# Patient Record
Sex: Male | Born: 1955 | Race: White | Hispanic: No | Marital: Single | Smoking: Never smoker
Health system: Southern US, Community
[De-identification: ages and names within clinical notes are randomized; demographics above are authoritative.]

## PROBLEM LIST (undated history)

## (undated) DIAGNOSIS — I10 Essential (primary) hypertension: Secondary | ICD-10-CM

## (undated) DIAGNOSIS — G40909 Epilepsy, unspecified, not intractable, without status epilepticus: Secondary | ICD-10-CM

## (undated) DIAGNOSIS — E079 Disorder of thyroid, unspecified: Secondary | ICD-10-CM

## (undated) DIAGNOSIS — F840 Autistic disorder: Secondary | ICD-10-CM

---

## 2013-01-12 ENCOUNTER — Encounter (HOSPITAL_COMMUNITY): Payer: Self-pay | Admitting: *Deleted

## 2013-01-12 ENCOUNTER — Emergency Department (HOSPITAL_COMMUNITY)
Admission: EM | Admit: 2013-01-12 | Discharge: 2013-01-13 | Disposition: A | Discharge status: Home / Self Care | Payer: Medicare Other | Attending: Emergency Medicine | Admitting: Emergency Medicine

## 2013-01-12 DIAGNOSIS — R6250 Unspecified lack of expected normal physiological development in childhood: Secondary | ICD-10-CM | POA: Insufficient documentation

## 2013-01-12 DIAGNOSIS — Y9389 Activity, other specified: Secondary | ICD-10-CM | POA: Insufficient documentation

## 2013-01-12 DIAGNOSIS — T50901A Poisoning by unspecified drugs, medicaments and biological substances, accidental (unintentional), initial encounter: Secondary | ICD-10-CM

## 2013-01-12 DIAGNOSIS — Y92009 Unspecified place in unspecified non-institutional (private) residence as the place of occurrence of the external cause: Secondary | ICD-10-CM | POA: Insufficient documentation

## 2013-01-12 DIAGNOSIS — T38801A Poisoning by unspecified hormones and synthetic substitutes, accidental (unintentional), initial encounter: Secondary | ICD-10-CM | POA: Insufficient documentation

## 2013-01-12 DIAGNOSIS — G40909 Epilepsy, unspecified, not intractable, without status epilepticus: Secondary | ICD-10-CM | POA: Insufficient documentation

## 2013-01-12 DIAGNOSIS — I10 Essential (primary) hypertension: Secondary | ICD-10-CM | POA: Insufficient documentation

## 2013-01-12 DIAGNOSIS — Z79899 Other long term (current) drug therapy: Secondary | ICD-10-CM | POA: Insufficient documentation

## 2013-01-12 DIAGNOSIS — R404 Transient alteration of awareness: Secondary | ICD-10-CM | POA: Insufficient documentation

## 2013-01-12 HISTORY — DX: Autistic disorder: F84.0

## 2013-01-12 HISTORY — DX: Epilepsy, unspecified, not intractable, without status epilepticus: G40.909

## 2013-01-12 HISTORY — DX: Essential (primary) hypertension: I10

## 2013-01-12 NOTE — ED Notes (Signed)
Report by caregiver pt accidentally took 10 sprays of a medication that he normally uses to prevent bedwetting. There are multiple other family/caregiver issues, but the pertinent reason is pt is exhibiting sxs consistent w/ taking too much of this medication. Pt is reported to have been lethargic and confused.

## 2013-01-12 NOTE — ED Notes (Signed)
Per family and pt report: Pt took 10 sprays of his desmopressin last night around 11pm. Pt's family has noticed increased lethargy.  Pt was observed walking into room with no difficulty.  Pt is a/o x 4.  Pt hx Autism and mild epilepsy.

## 2013-01-13 NOTE — ED Notes (Signed)
Pt bladder scan is 0ml.  MD made aware.

## 2013-01-13 NOTE — ED Provider Notes (Signed)
History     CSN: [id number]  Arrival date & time 01/12/13  2127   First MD Initiated Contact with Patient 01/12/13 2358      Chief Complaint  Patient presents with  . Drug Overdose    (Consider location/radiation/quality/duration/timing/severity/associated sxs/prior treatment) HPI Blake Richards is a 58 y.o. male with h/o M/R, autism, epilepsy who takes DDAVP occasionally for nocturnal enureis presents because he used about 10 sprays of his intranasal DDAVP thinking it was his saline for congestion.  No SI/HI.  Pt has had some difficulties recently with change in routines, but otherwise is well.  No complaints of pain.  He has been slightly sleepier today.  No other symptoms, no urinary retention, no seizure.  This event occurred 36 hours ago, last evening.  Past Medical History  Diagnosis Date  . Autism   . Epilepsy   . Hypertension     History reviewed. No pertinent past surgical history.  No family history on file.  History  Substance Use Topics  . Smoking status: Never Smoker   . Smokeless tobacco: Not on file  . Alcohol Use: No      Review of Systems At least 10pt or greater review of systems completed and are negative except where specified in the HPI.  Allergies  Lactose intolerance (gi)  Home Medications   Current Outpatient Rx  Name  Route  Sig  Dispense  Refill  . acetaZOLAMIDE (DIAMOX) 250 MG tablet   Oral   Take 250 mg by mouth 2 (two) times daily.         Marland Kitchen ALPRAZolam (XANAX) 0.25 MG tablet   Oral   Take 0.25 mg by mouth as needed for anxiety. Take a dose with increased anxiety hyperactivity after the 1mg  of clonazepam.         . benzonatate (TESSALON) 100 MG capsule   Oral   Take 200 mg by mouth 3 (three) times daily as needed for cough.         . carbamazepine (TEGRETOL XR) 400 MG 12 hr tablet   Oral   Take 800 mg by mouth 2 (two) times daily.         . clonazePAM (KLONOPIN) 1 MG tablet   Oral   Take 0.5-1 mg by mouth daily as needed  for anxiety.         Marland Kitchen desmopressin (DDAVP) 0.01 % SOLN   Nasal   Place 1 spray into the nose at bedtime.         . levETIRAcetam (KEPPRA) 500 MG tablet   Oral   Take 500-750 mg by mouth every 12 (twelve) hours. 750 moring and 500 mg in the evening         . Multiple Vitamin (MULTIVITAMIN WITH MINERALS) TABS   Oral   Take 1 tablet by mouth daily.         Marland Kitchen omega-3 acid ethyl esters (LOVAZA) 1 G capsule   Oral   Take 1 g by mouth daily.         Marland Kitchen oxymetazoline (AFRIN) 0.05 % nasal spray   Nasal   Place 1 spray into the nose as needed for congestion.         . sertraline (ZOLOFT) 100 MG tablet   Oral   Take 100 mg by mouth daily.         . simvastatin (ZOCOR) 40 MG tablet   Oral   Take 40 mg by mouth every morning.         Marland Kitchen  thyroid (ARMOUR) 60 MG tablet   Oral   Take 60 mg by mouth daily.         Marland Kitchen triamterene-hydrochlorothiazide (DYAZIDE) 37.5-25 MG per capsule   Oral   Take 1 capsule by mouth daily.         Marland Kitchen venlafaxine (EFFEXOR) 75 MG tablet   Oral   Take 75 mg by mouth daily.         Marland Kitchen VITAMIN D, ERGOCALCIFEROL, PO   Oral   Take 500 Units by mouth daily.           BP 151/95  Pulse 87  Temp(Src) 97.5 F (36.4 C) (Axillary)  Resp 15  SpO2 97%  Physical Exam  Nursing notes reviewed.  Electronic medical record reviewed. VITAL SIGNS:   Filed Vitals:   01/12/13 2137 01/12/13 2234 01/13/13 0247  BP: 151/95  136/63  Pulse: 87  71  Temp:  97.5 F (36.4 C) 97.7 F (36.5 C)  TempSrc:  Axillary Oral  Resp: 15  20  SpO2: 97%  95%   CONSTITUTIONAL: Awake, oriented, appears non-toxic HENT: Atraumatic, normocephalic, oral mucosa pink and moist, airway patent. Nares patent without drainage. External ears normal. EYES: Conjunctiva clear, EOMI, PERRLA NECK: Trachea midline, non-tender, supple CARDIOVASCULAR: Normal heart rate, Normal rhythm, No murmurs, rubs, gallops PULMONARY/CHEST: Clear to auscultation, no rhonchi, wheezes, or  rales. Symmetrical breath sounds. Non-tender. ABDOMINAL: Non-distended, soft, non-tender - no rebound or guarding.  BS normal. NEUROLOGIC: Non-focal, moving all four extremities, no gross sensory or motor deficits. EXTREMITIES: No clubbing, cyanosis, or edema SKIN: Warm, Dry, No erythema, No rash  ED Course  Procedures (including critical care time)  Labs Reviewed - No data to display No results found.   1. Accidental overdose, initial encounter       MDM  Discussed case with Poison control.  No symptoms to date, pt is at baseline now, no symptoms noted, DDAVP has a short half life.  No further intervention or observation indicated.  No co-ingestant suggested.  No other workup indicated.  DC stable in good condition to home.       Jones Skene, MD 01/16/13 1126

## 2013-10-30 ENCOUNTER — Emergency Department (HOSPITAL_COMMUNITY): Payer: Medicare Other

## 2013-10-30 ENCOUNTER — Inpatient Hospital Stay (HOSPITAL_COMMUNITY)
Admission: EM | Admit: 2013-10-30 | Discharge: 2013-11-04 | DRG: 391 | Disposition: A | Discharge status: Home / Self Care | Payer: Medicare Other | Attending: Internal Medicine | Admitting: Internal Medicine

## 2013-10-30 ENCOUNTER — Encounter (HOSPITAL_COMMUNITY): Payer: Self-pay | Admitting: Emergency Medicine

## 2013-10-30 DIAGNOSIS — N179 Acute kidney failure, unspecified: Secondary | ICD-10-CM

## 2013-10-30 DIAGNOSIS — E872 Acidosis, unspecified: Secondary | ICD-10-CM

## 2013-10-30 DIAGNOSIS — I10 Essential (primary) hypertension: Secondary | ICD-10-CM | POA: Diagnosis present

## 2013-10-30 DIAGNOSIS — Z79899 Other long term (current) drug therapy: Secondary | ICD-10-CM

## 2013-10-30 DIAGNOSIS — G9340 Encephalopathy, unspecified: Secondary | ICD-10-CM | POA: Diagnosis present

## 2013-10-30 DIAGNOSIS — A088 Other specified intestinal infections: Principal | ICD-10-CM | POA: Diagnosis present

## 2013-10-30 DIAGNOSIS — G40909 Epilepsy, unspecified, not intractable, without status epilepticus: Secondary | ICD-10-CM | POA: Diagnosis present

## 2013-10-30 DIAGNOSIS — E739 Lactose intolerance, unspecified: Secondary | ICD-10-CM | POA: Diagnosis present

## 2013-10-30 DIAGNOSIS — R197 Diarrhea, unspecified: Secondary | ICD-10-CM

## 2013-10-30 DIAGNOSIS — R112 Nausea with vomiting, unspecified: Secondary | ICD-10-CM

## 2013-10-30 DIAGNOSIS — E876 Hypokalemia: Secondary | ICD-10-CM | POA: Diagnosis present

## 2013-10-30 DIAGNOSIS — D72829 Elevated white blood cell count, unspecified: Secondary | ICD-10-CM

## 2013-10-30 DIAGNOSIS — E860 Dehydration: Secondary | ICD-10-CM | POA: Diagnosis present

## 2013-10-30 DIAGNOSIS — F840 Autistic disorder: Secondary | ICD-10-CM | POA: Diagnosis present

## 2013-10-30 LAB — COMPREHENSIVE METABOLIC PANEL
ALT: 67 U/L — ABNORMAL HIGH (ref 0–53)
AST: 19 U/L (ref 0–37)
Albumin: 3.2 g/dL — ABNORMAL LOW (ref 3.5–5.2)
Alkaline Phosphatase: 131 U/L — ABNORMAL HIGH (ref 39–117)
Chloride: 95 mEq/L — ABNORMAL LOW (ref 96–112)
Potassium: 2.2 mEq/L — CL (ref 3.5–5.1)
Sodium: 134 mEq/L — ABNORMAL LOW (ref 135–145)
Total Bilirubin: 0.5 mg/dL (ref 0.3–1.2)
Total Protein: 7.4 g/dL (ref 6.0–8.3)

## 2013-10-30 LAB — MAGNESIUM: Magnesium: 2.3 mg/dL (ref 1.5–2.5)

## 2013-10-30 LAB — CBC
HCT: 46.2 % (ref 39.0–52.0)
MCHC: 36.6 g/dL — ABNORMAL HIGH (ref 30.0–36.0)
RDW: 13.3 % (ref 11.5–15.5)
WBC: 18.2 10*3/uL — ABNORMAL HIGH (ref 4.0–10.5)

## 2013-10-30 MED ORDER — CARBAMAZEPINE ER 400 MG PO TB12
800.0000 mg | ORAL_TABLET | Freq: Two times a day (BID) | ORAL | Status: DC
  Filled 2013-10-30: qty 2

## 2013-10-30 MED ORDER — SODIUM CHLORIDE 0.9 % IV BOLUS (SEPSIS)
1000.0000 mL | Freq: Once | INTRAVENOUS | Status: AC
  Administered 2013-10-30: 1000 mL via INTRAVENOUS

## 2013-10-30 MED ORDER — ONDANSETRON HCL 4 MG/2ML IJ SOLN: 4.0000 mg | Freq: Four times a day (QID) | INTRAMUSCULAR | Status: DC | PRN

## 2013-10-30 MED ORDER — ACETAMINOPHEN 325 MG PO TABS: 650.0000 mg | ORAL_TABLET | Freq: Four times a day (QID) | ORAL | Status: DC | PRN

## 2013-10-30 MED ORDER — TRIAMTERENE-HCTZ 37.5-25 MG PO CAPS
1.0000 | ORAL_CAPSULE | Freq: Every day | ORAL | Status: DC
  Administered 2013-10-30: 20:00:00 1 via ORAL
  Filled 2013-10-30 (×2): qty 1

## 2013-10-30 MED ORDER — LEVETIRACETAM 500 MG PO TABS
500.0000 mg | ORAL_TABLET | Freq: Every evening | ORAL | Status: DC
Start: 2013-10-30 — End: 2013-11-04
  Administered 2013-10-30 – 2013-11-03 (×5): 500 mg via ORAL
  Filled 2013-10-30 (×6): qty 1

## 2013-10-30 MED ORDER — ACETAMINOPHEN 650 MG RE SUPP: 650.0000 mg | Freq: Four times a day (QID) | RECTAL | Status: DC | PRN

## 2013-10-30 MED ORDER — POTASSIUM CHLORIDE 10 MEQ/100ML IV SOLN
10.0000 meq | INTRAVENOUS | Status: AC
  Administered 2013-10-30 – 2013-10-31 (×6): 10 meq via INTRAVENOUS
  Filled 2013-10-30 (×6): qty 100

## 2013-10-30 MED ORDER — LEVETIRACETAM 500 MG PO TABS
750.0000 mg | ORAL_TABLET | Freq: Every day | ORAL | Status: DC
  Administered 2013-10-31 – 2013-11-03 (×4): 750 mg via ORAL
  Filled 2013-10-30 (×5): qty 1.5

## 2013-10-30 MED ORDER — ONDANSETRON HCL 4 MG/2ML IJ SOLN
4.0000 mg | Freq: Once | INTRAMUSCULAR | Status: AC
  Administered 2013-10-30: 4 mg via INTRAVENOUS
  Filled 2013-10-30: qty 2

## 2013-10-30 MED ORDER — ACETAZOLAMIDE 250 MG PO TABS
250.0000 mg | ORAL_TABLET | Freq: Two times a day (BID) | ORAL | Status: DC
  Administered 2013-10-30 – 2013-11-03 (×9): 250 mg via ORAL
  Filled 2013-10-30 (×11): qty 1

## 2013-10-30 MED ORDER — THYROID 60 MG PO TABS
60.0000 mg | ORAL_TABLET | Freq: Every day | ORAL | Status: DC
  Administered 2013-10-30 – 2013-11-03 (×5): 60 mg via ORAL
  Filled 2013-10-30 (×6): qty 1

## 2013-10-30 MED ORDER — SODIUM CHLORIDE 0.9 % IV SOLN
INTRAVENOUS | Status: DC
  Administered 2013-10-31 – 2013-11-01 (×2): via INTRAVENOUS

## 2013-10-30 MED ORDER — ONDANSETRON HCL 4 MG/2ML IJ SOLN: 4.0000 mg | Freq: Three times a day (TID) | INTRAMUSCULAR | Status: AC | PRN

## 2013-10-30 MED ORDER — SIMVASTATIN 40 MG PO TABS
40.0000 mg | ORAL_TABLET | Freq: Every morning | ORAL | Status: DC
  Administered 2013-10-30 – 2013-11-03 (×5): 40 mg via ORAL
  Filled 2013-10-30 (×6): qty 1

## 2013-10-30 MED ORDER — POTASSIUM CHLORIDE 10 MEQ/100ML IV SOLN
10.0000 meq | Freq: Once | INTRAVENOUS | Status: AC
  Administered 2013-10-30: 10 meq via INTRAVENOUS
  Filled 2013-10-30: qty 100

## 2013-10-30 MED ORDER — SODIUM CHLORIDE 0.9 % IJ SOLN
3.0000 mL | Freq: Two times a day (BID) | INTRAMUSCULAR | Status: DC
  Administered 2013-11-01: 10:00:00 3 mL via INTRAVENOUS

## 2013-10-30 MED ORDER — VENLAFAXINE HCL 75 MG PO TABS
75.0000 mg | ORAL_TABLET | Freq: Every day | ORAL | Status: DC
  Administered 2013-10-30 – 2013-11-03 (×5): 75 mg via ORAL
  Filled 2013-10-30 (×7): qty 1

## 2013-10-30 MED ORDER — ENOXAPARIN SODIUM 40 MG/0.4ML ~~LOC~~ SOLN
40.0000 mg | SUBCUTANEOUS | Status: DC
  Administered 2013-10-30 – 2013-11-03 (×6): 40 mg via SUBCUTANEOUS
  Filled 2013-10-30 (×6): qty 0.4

## 2013-10-30 MED ORDER — POTASSIUM CITRATE-CITRIC ACID 1100-334 MG/5ML PO SOLN: 10.0000 meq | Freq: Three times a day (TID) | ORAL | Status: DC

## 2013-10-30 MED ORDER — ONDANSETRON HCL 4 MG PO TABS: 4.0000 mg | ORAL_TABLET | Freq: Four times a day (QID) | ORAL | Status: DC | PRN

## 2013-10-30 MED ORDER — SODIUM CHLORIDE 0.9 % IV SOLN
INTRAVENOUS | Status: DC
  Administered 2013-10-30 – 2013-10-31 (×2): via INTRAVENOUS

## 2013-10-30 NOTE — H&P (Signed)
[facility]          History and Physical    PCP:   No primary provider on file.   Chief Complaint:  Lethargy, nausea, vomiting, diarrhea, weakness  HPI: Patient is a 57 year old white man with history of epilepsy as well as autism was fairly independent at baseline. His caretaker, who is the husband of patient's cousin, is present and provides most of the history as patient is currently quite lethargic. He states that since Monday (4 days prior to admission) he started experiencing projectile vomiting and explosive diarrhea. He went to his PCPs office where a flu swab reportedly was negative. He was prescribed Phenergan which seemed to control the nausea. This morning the caretaker was unable to wake patient up. He finally opened his eyes he was very groggy and fell as they were trying to walk down the stairs. He decided to bring him into the hospital for evaluation where he is found to have a potassium of 2.2 a creatinine of 4.17. We have been asked to admit him for further evaluation and management.  Allergies:   Allergies  Allergen Reactions  . Lactose Intolerance (Gi) Other (See Comments)    Upset stomach      Past Medical History  Diagnosis Date  . Autism   . Epilepsy   . Hypertension     History reviewed. No pertinent past surgical history.  Prior to Admission medications   Medication Sig Start Date End Date Taking? Authorizing Provider  acetaZOLAMIDE (DIAMOX) 250 MG tablet Take 250 mg by mouth 2 (two) times daily.   Yes Historical Provider, MD  ALPRAZolam (XANAX) 0.25 MG tablet Take 0.25 mg by mouth 2 (two) times daily as needed for anxiety.    Yes Historical Provider, MD  benzonatate (TESSALON) 100 MG capsule Take 200 mg by mouth 3 (three) times daily as needed for cough.   Yes Historical Provider, MD  carbamazepine (TEGRETOL XR) 400 MG 12 hr tablet Take 800 mg by mouth 2 (two) times daily.   Yes Historical Provider, MD  levETIRAcetam (KEPPRA) 500 MG  tablet Take 500-750 mg by mouth every 12 (twelve) hours. 750 moring and 500 mg in the evening   Yes Historical Provider, MD  promethazine (PHENERGAN) 25 MG tablet Take 25 mg by mouth every 6 (six) hours as needed for nausea or vomiting.   Yes Historical Provider, MD  sertraline (ZOLOFT) 100 MG tablet Take 100 mg by mouth daily.   Yes Historical Provider, MD  simvastatin (ZOCOR) 40 MG tablet Take 40 mg by mouth every morning.   Yes Historical Provider, MD  thyroid (ARMOUR) 60 MG tablet Take 60 mg by mouth daily.   Yes Historical Provider, MD  triamterene-hydrochlorothiazide (DYAZIDE) 37.5-25 MG per capsule Take 1 capsule by mouth daily.   Yes Historical Provider, MD  venlafaxine (EFFEXOR) 75 MG tablet Take 75 mg by mouth daily.   Yes Historical Provider, MD    Social History:  reports that he has never smoked. He does not have any smokeless tobacco history on file. He reports that he does not drink alcohol or use illicit drugs.  History reviewed. No pertinent family history.  Review of Systems:  Unable to obtain given current mental state  Physical Exam: Blood pressure 153/95, pulse 90, temperature 97.6 F (36.4 C), temperature source Axillary, resp. rate 16, height 0' (0 m), weight 96.163 kg (212 lb), SpO2 100.00%. General: Lethargic -HEENT: Normocephalic, atraumatic, pupils equal and reactive to light, very dry mucous membranes. Neck:  Supple, no JVD, no lymphadenopathy, no bruits, no goiter. Cardiovascular: Tachycardic, regular rhythm, no murmurs, rubs or gallops. Lungs: Clear auscultation bilaterally. Abdomen: Soft, nontender, nondistended, positive bowel sounds. Extremities: No clubbing, cyanosis or edema, positive pedal pulses. Small abrasion below his right knee. Neurologic: Unable to fully examine given his current mental state  Labs on Admission:  Results for orders placed during the hospital encounter of 10/30/13 (from the past 48 hour(s))  CBC     Status: Abnormal    Collection Time    10/30/13  1:43 PM      Result Value Range   WBC 18.2 (*) 4.0 - 10.5 K/uL   RBC 5.43  4.22 - 5.81 MIL/uL   Hemoglobin 16.9  13.0 - 17.0 g/dL   HCT 66.4  40.3 - 47.4 %   MCV 85.1  78.0 - 100.0 fL   MCH 31.1  26.0 - 34.0 pg   MCHC 36.6 (*) 30.0 - 36.0 g/dL   RDW 25.9  56.3 - 87.5 %   Platelets 297  150 - 400 K/uL  COMPREHENSIVE METABOLIC PANEL     Status: Abnormal   Collection Time    10/30/13  1:43 PM      Result Value Range   Sodium 134 (*) 135 - 145 mEq/L   Comment: REPEATED TO VERIFY   Potassium 2.2 (*) 3.5 - 5.1 mEq/L   Comment: CRITICAL RESULT CALLED TO, READ BACK BY AND VERIFIED WITH:     S. COE RN AT 1440 ON 12.25.14 BY SHUEA   Chloride 95 (*) 96 - 112 mEq/L   Comment: REPEATED TO VERIFY   CO2 16 (*) 19 - 32 mEq/L   Comment: REPEATED TO VERIFY   Glucose, Bld 171 (*) 70 - 99 mg/dL   BUN 97 (*) 6 - 23 mg/dL   Creatinine, Ser 6.43 (*) 0.50 - 1.35 mg/dL   Calcium 8.3 (*) 8.4 - 10.5 mg/dL   Total Protein 7.4  6.0 - 8.3 g/dL   Albumin 3.2 (*) 3.5 - 5.2 g/dL   AST 19  0 - 37 U/L   ALT 67 (*) 0 - 53 U/L   Alkaline Phosphatase 131 (*) 39 - 117 U/L   Total Bilirubin 0.5  0.3 - 1.2 mg/dL   GFR calc non Af Amer 15 (*) >90 mL/min   GFR calc Af Amer 17 (*) >90 mL/min   Comment: (NOTE)     The eGFR has been calculated using the CKD EPI equation.     This calculation has not been validated in all clinical situations.     eGFR's persistently <90 mL/min signify possible Chronic Kidney     Disease.  LIPASE, BLOOD     Status: None   Collection Time    10/30/13  1:43 PM      Result Value Range   Lipase 24  11 - 59 U/L  CARBAMAZEPINE LEVEL, TOTAL     Status: Abnormal   Collection Time    10/30/13  1:43 PM      Result Value Range   Carbamazepine Lvl 17.2 (*) 4.0 - 12.0 ug/mL   Comment: CRITICAL RESULT CALLED TO, READ BACK BY AND VERIFIED WITHBonnita Nasuti [id number] 10/30/13 D BRADLEY     Performed at [facility]  MAGNESIUM     Status: None   Collection  Time    10/30/13  1:43 PM      Result Value Range   Magnesium 2.3  1.5 -  2.5 mg/dL  LACTIC ACID, PLASMA     Status: None   Collection Time    10/30/13  3:12 PM      Result Value Range   Lactic Acid, Venous 2.1  0.5 - 2.2 mmol/L    Radiological Exams on Admission: Dg Abd Acute W/chest  10/30/2013   CLINICAL DATA:  Nausea vomiting and diarrhea.  EXAM: ACUTE ABDOMEN SERIES (ABDOMEN 2 VIEW & CHEST 1 VIEW)  COMPARISON:  None.  FINDINGS: There is a a scar versus platelike atelectasis noted in the right lung base.  The bowel gas pattern is abnormal. Within the left lower quadrant there are dilated loops of small bowel measuring up to 5.1 cm. Gas is noted within the colon up to the level of the rectum. Multiple small and large bowel fluid levels are identified.  IMPRESSION: 1. Abnormal bowel gas pattern which is nonspecific. Although the left lower quadrant small bowel loops are dilated there is gas seen throughout the colon up to the level of the rectum. Serial abdominal radiographs advised. If the patient's symptoms worsen or persist then consider further assessment with CT of the abdomen pelvis with oral contrast.   Electronically Signed   By: Signa Kell M.D.   On: 10/30/2013 15:19    Assessment/Plan Principal Problem:   Acute encephalopathy Active Problems:   Hypokalemia   Nausea with vomiting   Diarrhea   ARF (acute renal failure)   Leukocytosis, unspecified   Metabolic acidosis   Acute encephalopathy -Suspect related to a combination of his severe dehydration and sedating meds including Xanax and Phenergan. -Hold offending medications, IV fluids. -See below for details. -Do not believe a CT/MRI of the head will be necessary unless he fails to respond to fluids.  Nausea/vomiting/diarrhea -Suspect related to acute viral gastroenteritis.  -per caretaker this is already improved. -Treat symptomatically at this point. -See no need for antibiotics at this  time.  Hypokalemia -Potassium is 2.2 on admission. -Replete IV. -Check magnesium level. -Related to severe GI losses.  Acute renal failure/metabolic acidosis -Suspect related to severe dehydration.  -Normal saline at 100 cc an hour, follow renal function and electrolytes. -Consider renal ultrasound if creatinine fails to improve with fluids.  Leukocytosis -Suspect related to acute viral gastroenteritis.  Seizure disorder -Continue seizure medications.  DVT prophylaxis -Lovenox.  CODE STATUS -Full code   Time Spent on Admission: 75 minutes  HERNANDEZ ACOSTA,ESTELA [facility] Pager: [phone] 10/30/2013, 6:02 PM

## 2013-10-30 NOTE — ED Notes (Signed)
Pt undressed, warm blanket given, family sitting at bedside.

## 2013-10-30 NOTE — ED Notes (Addendum)
Per EMS pt coming from home with c/o n/v/d since Monday. Pt was seen by his PCP on Tuesday, was told has stomach virus, tested negative for flu. Pt has hx of MR and lives with cousin and his wife. Per EMS house is unkempt with abundance of cats and cat's urine and feces "is everywhere". Pt's cousin reports last emesis was little over 24 hours ago and last episode of diarrhea was this morning.

## 2013-10-30 NOTE — ED Notes (Signed)
MD at bedside. 

## 2013-10-30 NOTE — ED Notes (Signed)
Pt unable to urinate at this time. Family at bedside. Will notify staff when able.

## 2013-10-30 NOTE — ED Provider Notes (Signed)
Level V caveat patient mentally challenged history is obtained from patient's caregiver and guardian. Patient with vomiting and diarrhea onset 2 days ago, patient more sleepy today.Marland Kitchen He's been medicated with Xanax and Phenergan. No vomiting since yesterday no diarrhea since yesterday.  he's been drinking small amounts today. On exam sleepy easily arousable HEENT exam mucous membranes dry eyes pupils react to light equal to 3 mm lungs clear equal breath sounds heart regular in rhythm abdomen nondistended nontender extremities without edema. Patient noted to be hypokalemic and renal failure most likely secondary to dehydration, nausea and vomiting. Abnormally sleeping likely secondary to medication with benzodiazepines and Phenergan  Doug Sou, MD 10/30/13 1606

## 2013-10-30 NOTE — ED Notes (Signed)
Bed: WA08 Expected date:  Expected time:  Means of arrival:  Comments: Dehydratiopn N,V,D

## 2013-10-30 NOTE — ED Provider Notes (Signed)
CSN: [id number]     Arrival date & time 10/30/13  1231 History   First MD Initiated Contact with Patient 10/30/13 1233     Chief Complaint  Patient presents with  . Nausea  . Emesis  . Diarrhea   (Consider location/radiation/quality/duration/timing/severity/associated sxs/prior Treatment) HPI Comments: HPI Blake Richards is a 57 y.o. male with h/o M/R, autism, epilepsy presents to the Emergency department with a chief compliant of abdominal pain.  The history is provided by the patient's legal guardian. He reports several episodes of vomiting 3 days agp.  He was elavulated by his PCP 2 days ago and was diagnosed with a viral GI illness and was prescribed phenergan. Last emesis was 2 days ago.  He reports several episodes of non-bloody diarrhea, last episode yesterday. He reports giving the patient phenergan every 8 hours, as prescribed. The patient's caregiver reports the patient has been sleeping more over the past two days.  He reports he fell as he was trying to ambulate the patient at home. The patient's caregiver reports giving all of his daily medication today at 1000. Last Tetanus unknown. PCP: Youlanda Mighty, ([place], )  The history is provided by a caregiver and the patient. No language interpreter was used.    Past Medical History  Diagnosis Date  . Autism   . Epilepsy   . Hypertension    History reviewed. No pertinent past surgical history. History reviewed. No pertinent family history. History  Substance Use Topics  . Smoking status: Never Smoker   . Smokeless tobacco: Not on file  . Alcohol Use: No    Review of Systems  Constitutional: Positive for activity change and fatigue. Negative for fever and chills.  Gastrointestinal: Positive for nausea, vomiting, abdominal pain and diarrhea. Negative for constipation and blood in stool.  Neurological: Positive for weakness and light-headedness. Negative for seizures.    Allergies  Lactose intolerance (gi)  Home  Medications   Current Outpatient Rx  Name  Route  Sig  Dispense  Refill  . acetaZOLAMIDE (DIAMOX) 250 MG tablet   Oral   Take 250 mg by mouth 2 (two) times daily.         Marland Kitchen ALPRAZolam (XANAX) 0.25 MG tablet   Oral   Take 0.25 mg by mouth 2 (two) times daily as needed for anxiety.          . benzonatate (TESSALON) 100 MG capsule   Oral   Take 200 mg by mouth 3 (three) times daily as needed for cough.         . carbamazepine (TEGRETOL XR) 400 MG 12 hr tablet   Oral   Take 800 mg by mouth 2 (two) times daily.         Marland Kitchen levETIRAcetam (KEPPRA) 500 MG tablet   Oral   Take 500-750 mg by mouth every 12 (twelve) hours. 750 moring and 500 mg in the evening         . promethazine (PHENERGAN) 25 MG tablet   Oral   Take 25 mg by mouth every 6 (six) hours as needed for nausea or vomiting.         . sertraline (ZOLOFT) 100 MG tablet   Oral   Take 100 mg by mouth daily.         . simvastatin (ZOCOR) 40 MG tablet   Oral   Take 40 mg by mouth every morning.         . thyroid (ARMOUR) 60 MG tablet   Oral  Take 60 mg by mouth daily.         Marland Kitchen triamterene-hydrochlorothiazide (DYAZIDE) 37.5-25 MG per capsule   Oral   Take 1 capsule by mouth daily.         Marland Kitchen venlafaxine (EFFEXOR) 75 MG tablet   Oral   Take 75 mg by mouth daily.          BP 137/87  Pulse 96  Temp(Src) 100.4 F (38 C) (Rectal)  Resp 20  SpO2 95% Physical Exam  Nursing note and vitals reviewed. Constitutional: No distress.  HENT:  Head: Normocephalic and atraumatic.  Mouth/Throat: Mucous membranes are dry.  Eyes: Pupils are equal, round, and reactive to light. No scleral icterus.  Neck: Neck supple.  Pulmonary/Chest: No respiratory distress. He has no wheezes. He has no rhonchi.  Abdominal: Soft. Bowel sounds are normal. There is tenderness in the right lower quadrant. There is no rebound and no guarding.  Musculoskeletal:       Right knee: He exhibits normal range of motion and no  swelling.       Left knee: He exhibits normal range of motion and no swelling.       Legs: LE Ecchymosis to left patella.  Abrasion to right knee.  Neurological:  [place]  Skin: Skin is warm and dry.    ED Course  Procedures (including critical care time) Labs Review Labs Reviewed  CBC - Abnormal; Notable for the following:    WBC 18.2 (*)    MCHC 36.6 (*)    All other components within normal limits  COMPREHENSIVE METABOLIC PANEL - Abnormal; Notable for the following:    Sodium 134 (*)    Potassium 2.2 (*)    Chloride 95 (*)    CO2 16 (*)    Glucose, Bld 171 (*)    BUN 97 (*)    Creatinine, Ser 4.17 (*)    Calcium 8.3 (*)    Albumin 3.2 (*)    ALT 67 (*)    Alkaline Phosphatase 131 (*)    GFR calc non Af Amer 15 (*)    GFR calc Af Amer 17 (*)    All other components within normal limits  CARBAMAZEPINE LEVEL, TOTAL - Abnormal; Notable for the following:    Carbamazepine Lvl 17.2 (*)    All other components within normal limits  LIPASE, BLOOD  LACTIC ACID, PLASMA  MAGNESIUM  URINALYSIS, ROUTINE W REFLEX MICROSCOPIC   Imaging Review Dg Abd Acute W/chest  10/30/2013   CLINICAL DATA:  Nausea vomiting and diarrhea.  EXAM: ACUTE ABDOMEN SERIES (ABDOMEN 2 VIEW & CHEST 1 VIEW)  COMPARISON:  None.  FINDINGS: There is a a scar versus platelike atelectasis noted in the right lung base.  The bowel gas pattern is abnormal. Within the left lower quadrant there are dilated loops of small bowel measuring up to 5.1 cm. Gas is noted within the colon up to the level of the rectum. Multiple small and large bowel fluid levels are identified.  IMPRESSION: 1. Abnormal bowel gas pattern which is nonspecific. Although the left lower quadrant small bowel loops are dilated there is gas seen throughout the colon up to the level of the rectum. Serial abdominal radiographs advised. If the patient's symptoms worsen or persist then consider further assessment with CT of the abdomen pelvis with oral  contrast.   Electronically Signed   By: Signa Kell M.D.   On: 10/30/2013 15:19    EKG Interpretation    Date/Time:  Thursday  October 30 2013 14:54:29 EST Ventricular Rate:  95 PR Interval:  174 QRS Duration: 101 QT Interval:  342 QTC Calculation: 430 R Axis:   -42 Text Interpretation:  Sinus rhythm Probable left atrial enlargement Left axis deviation Low voltage, precordial leads RSR' in V1 or V2, probably normal variant No old tracing to compare Confirmed by JACUBOWITZ  MD, SAM ([id number]) on 10/30/2013 3:02:05 PM            MDM   1. Hypokalemia   2. Acute kidney injury    Pt with a history of GI illness presents with weakness, abdominal pain, decreased activity. PE concerning for drowsiness and generalized and RLQ.  Labs sent.Fluids started. Discussed patient history, condition, and labs with Dr. Ethelda Chick. Will replete K via IV. Mag-2.3 normal. Will continue to replete K. Hemodynamically stable. Discussed patient history and condition with Dr. Ardyth Harps who will admit the patient. Carbamazepine-17.2-cardiac monitoring ordered.    Clabe Seal, PA-C 10/31/13 [phone]

## 2013-10-31 LAB — CBC
HCT: 37.8 % — ABNORMAL LOW (ref 39.0–52.0)
MCV: 85.9 fL (ref 78.0–100.0)
Platelets: 271 10*3/uL (ref 150–400)
RBC: 4.4 MIL/uL (ref 4.22–5.81)
RDW: 13.5 % (ref 11.5–15.5)
WBC: 10.8 10*3/uL — ABNORMAL HIGH (ref 4.0–10.5)

## 2013-10-31 LAB — BASIC METABOLIC PANEL
CO2: 16 mEq/L — ABNORMAL LOW (ref 19–32)
Calcium: 7.3 mg/dL — ABNORMAL LOW (ref 8.4–10.5)
Creatinine, Ser: 2.5 mg/dL — ABNORMAL HIGH (ref 0.50–1.35)
GFR calc non Af Amer: 27 mL/min — ABNORMAL LOW (ref >90)
Sodium: 133 mEq/L — ABNORMAL LOW (ref 135–145)

## 2013-10-31 LAB — CARBAMAZEPINE LEVEL, TOTAL: Carbamazepine Lvl: 14.2 ug/mL — ABNORMAL HIGH (ref 4.0–12.0)

## 2013-10-31 MED ORDER — TRIAMTERENE-HCTZ 37.5-25 MG PO TABS
1.0000 | ORAL_TABLET | Freq: Every day | ORAL | Status: DC
  Administered 2013-10-31 – 2013-11-03 (×4): 1 via ORAL
  Filled 2013-10-31 (×5): qty 1

## 2013-10-31 MED ORDER — POTASSIUM CHLORIDE 10 MEQ/100ML IV SOLN
10.0000 meq | INTRAVENOUS | Status: DC
  Filled 2013-10-31 (×6): qty 100

## 2013-10-31 MED ORDER — POTASSIUM CHLORIDE 10 MEQ/100ML IV SOLN
10.0000 meq | INTRAVENOUS | Status: AC
  Administered 2013-10-31 (×4): 10 meq via INTRAVENOUS
  Filled 2013-10-31 (×4): qty 100

## 2013-10-31 MED ORDER — POTASSIUM CHLORIDE CRYS ER 20 MEQ PO TBCR
40.0000 meq | EXTENDED_RELEASE_TABLET | ORAL | Status: AC
  Administered 2013-10-31 – 2013-11-01 (×5): 40 meq via ORAL
  Filled 2013-10-31 (×7): qty 2

## 2013-10-31 NOTE — ED Provider Notes (Signed)
Medical screening examination/treatment/procedure(s) were conducted as a shared visit with non-physician practitioner(s) and myself.  I personally evaluated the patient during the encounter.  EKG Interpretation    Date/Time:  Thursday October 30 2013 14:54:29 EST Ventricular Rate:  95 PR Interval:  174 QRS Duration: 101 QT Interval:  342 QTC Calculation: 430 R Axis:   -42 Text Interpretation:  Sinus rhythm Probable left atrial enlargement Left axis deviation Low voltage, precordial leads RSR' in V1 or V2, probably normal variant No old tracing to compare Confirmed by Ethelda Chick  MD, Aavya Shafer ([id number]) on 10/30/2013 3:02:05 PM             Doug Sou, MD 10/31/13 1736

## 2013-10-31 NOTE — Care Management Note (Unsigned)
    Page 1 of 1   10/31/2013     12:13:42 PM   CARE MANAGEMENT NOTE 10/31/2013  Patient:  Blake Richards, Blake Richards   Account Number:  [id number]  Date Initiated:  10/31/2013  Documentation initiated by:  Colleen Can  Subjective/Objective Assessment:   dx Acute kidney injury, abd pain, hyporkalemia, dehydration     Action/Plan:   Pt from home/lives with relatives.   Anticipated DC Date:  11/02/2013   Anticipated DC Plan:        DC Planning Services  CM consult      Choice offered to / List presented to:             Status of service:  Completed, signed off Medicare Important Message given?   (If response is "NO", the following Medicare IM given date fields will be blank) Date Medicare IM given:   Date Additional Medicare IM given:    Discharge Disposition:    Per UR Regulation:  Reviewed for med. necessity/level of care/duration of stay  If discussed at Long Length of Stay Meetings, dates discussed:    Comments:

## 2013-10-31 NOTE — Progress Notes (Signed)
CRITICAL VALUE ALERT  Critical value received:  K 2.3  Date of notification:  10/31/13  Time of notification:  0522  Critical value read back:yes  Nurse who received alert:  Charolotte Capuchin   MD notified (1st page):  Benedetto Coons, NP   Time of first page:  0525  MD notified (2nd page):  Time of second page:  Responding MD:  Benedetto Coons, NP  Time MD responded:  [phone]  Orders placed for 4 runs of potassium

## 2013-10-31 NOTE — Progress Notes (Signed)
[facility] PROGRESS NOTE  Terryn Redner AVW:[id number] DOB: [birth year] DOA: 10/30/2013 PCP: No primary provider on file.  Assessment/Plan: Acute Encephalopathy -Resolved. -Back to baseline. -Suspect related to severe dehydration and sedating medications.  N/V/D -Suspect 2/2 viral gastroenteritis. -Has already resolved.  Hypokalemia -K remains low today. -Mag ok. -Continue to replete PO. -2/2 severe GI losses.   ARF/Metabolic Acidosis -Improving with IVF. -Cr was 4.17 on admission, now down to 2.50.  Seizure Disorder -Continue home meds.  Leukocytosis -2/2 viral gastroenteritis. -Improving.  Code Status: Full Code Family Communication: Caregiver at bedside  Disposition Plan: Home when stable, likely 24-48 hours.   Consultants:  None   Antibiotics:  None   Subjective: No complaints. Feels well.  Objective: Filed Vitals:   10/30/13 2212 10/31/13 0507 10/31/13 0801 10/31/13 1422  BP: 148/88 126/71  141/79  Pulse: 89 82  81  Temp: 98.1 F (36.7 C) 97.3 F (36.3 C)  97.9 F (36.6 C)  TempSrc: Axillary Axillary  Oral  Resp: 20 20  20   Height:   6\' 1"  (1.854 m)   Weight:      SpO2: 100% 100%  99%    Intake/Output Summary (Last 24 hours) at 10/31/13 1659 Last data filed at 10/31/13 1500  Gross per 24 hour  Intake    360 ml  Output    750 ml  Net   -390 ml   Filed Weights   10/30/13 1730  Weight: 96.163 kg (212 lb)    Exam:   General:  AA Ox3  Cardiovascular: RRR  Respiratory: CTA B  Abdomen: S/NT/ND/+BS  Extremities: no C/C/E/+pedal pulses   Neurologic:  Non-focal.  Data Reviewed: Basic Metabolic Panel:  Recent Labs Lab 10/30/13 1343 10/31/13 0407  NA 134* 133*  K 2.2* 2.3*  CL 95* 102  CO2 16* 16*  GLUCOSE 171* 122*  BUN 97* 73*  CREATININE 4.17* 2.50*  CALCIUM 8.3* 7.3*  MG 2.3  --    Liver Function Tests:  Recent Labs Lab 10/30/13 1343  AST 19  ALT 67*  ALKPHOS 131*  BILITOT 0.5  PROT 7.4  ALBUMIN 3.2*     Recent Labs Lab 10/30/13 1343  LIPASE 24   No results found for this basename: AMMONIA,  in the last 168 hours CBC:  Recent Labs Lab 10/30/13 1343 10/31/13 0407  WBC 18.2* 10.8*  HGB 16.9 13.5  HCT 46.2 37.8*  MCV 85.1 85.9  PLT 297 271   Cardiac Enzymes: No results found for this basename: CKTOTAL, CKMB, CKMBINDEX, TROPONINI,  in the last 168 hours BNP (last 3 results) No results found for this basename: PROBNP,  in the last 8760 hours CBG: No results found for this basename: GLUCAP,  in the last 168 hours  No results found for this or any previous visit (from the past 240 hour(s)).   Studies: Dg Abd Acute W/chest  10/30/2013   CLINICAL DATA:  Nausea vomiting and diarrhea.  EXAM: ACUTE ABDOMEN SERIES (ABDOMEN 2 VIEW & CHEST 1 VIEW)  COMPARISON:  None.  FINDINGS: There is a a scar versus platelike atelectasis noted in the right lung base.  The bowel gas pattern is abnormal. Within the left lower quadrant there are dilated loops of small bowel measuring up to 5.1 cm. Gas is noted within the colon up to the level of the rectum. Multiple small and large bowel fluid levels are identified.  IMPRESSION: 1. Abnormal bowel gas pattern which is nonspecific. Although the left lower quadrant small bowel loops  are dilated there is gas seen throughout the colon up to the level of the rectum. Serial abdominal radiographs advised. If the patient's symptoms worsen or persist then consider further assessment with CT of the abdomen pelvis with oral contrast.   Electronically Signed   By: Signa Kell M.D.   On: 10/30/2013 15:19    Scheduled Meds: . acetaZOLAMIDE  250 mg Oral BID  . enoxaparin (LOVENOX) injection  40 mg Subcutaneous Q24H  . levETIRAcetam  500 mg Oral QPM  . levETIRAcetam  750 mg Oral Daily  . potassium chloride  40 mEq Oral Q4H  . simvastatin  40 mg Oral q morning - 10a  . sodium chloride  3 mL Intravenous Q12H  . thyroid  60 mg Oral Daily  .  triamterene-hydrochlorothiazide  1 tablet Oral Daily  . venlafaxine  75 mg Oral Daily   Continuous Infusions: . sodium chloride 100 mL/hr at 10/31/13 0420  . sodium chloride      Principal Problem:   Acute encephalopathy Active Problems:   Hypokalemia   Nausea with vomiting   Diarrhea   ARF (acute renal failure)   Leukocytosis, unspecified   Metabolic acidosis    Time spent: 35 minutes. Greater than 50% of this time was spent in direct contact with the patient coordinating care.    Chaya Jan  [facility] Pager [phone]  If 7PM-7AM, please contact night-coverage at [email or web address], password [facility] 10/31/2013, 4:59 PM  LOS: 1 day

## 2013-11-01 LAB — BASIC METABOLIC PANEL
BUN: 34 mg/dL — ABNORMAL HIGH (ref 6–23)
CO2: 18 mEq/L — ABNORMAL LOW (ref 19–32)
Calcium: 8.1 mg/dL — ABNORMAL LOW (ref 8.4–10.5)
GFR calc Af Amer: 59 mL/min — ABNORMAL LOW (ref >90)
GFR calc non Af Amer: 51 mL/min — ABNORMAL LOW (ref >90)
Glucose, Bld: 131 mg/dL — ABNORMAL HIGH (ref 70–99)
Potassium: 2.4 mEq/L — CL (ref 3.5–5.1)
Sodium: 133 mEq/L — ABNORMAL LOW (ref 135–145)

## 2013-11-01 MED ORDER — ONDANSETRON HCL 4 MG/2ML IJ SOLN
4.0000 mg | Freq: Four times a day (QID) | INTRAMUSCULAR | Status: DC | PRN
  Administered 2013-11-01: 03:00:00 4 mg via INTRAVENOUS
  Filled 2013-11-01: qty 2

## 2013-11-01 MED ORDER — POTASSIUM CHLORIDE CRYS ER 20 MEQ PO TBCR
40.0000 meq | EXTENDED_RELEASE_TABLET | ORAL | Status: AC
  Administered 2013-11-01 – 2013-11-02 (×4): 40 meq via ORAL
  Filled 2013-11-01 (×6): qty 2

## 2013-11-01 MED ORDER — CARBAMAZEPINE ER 400 MG PO TB12
800.0000 mg | ORAL_TABLET | Freq: Two times a day (BID) | ORAL | Status: DC
  Administered 2013-11-01 – 2013-11-03 (×6): 800 mg via ORAL
  Filled 2013-11-01 (×8): qty 2

## 2013-11-01 NOTE — Progress Notes (Addendum)
Pharmacy Consult: Monitor Tegretol Level  55 yoM with h/o epilepsy on Carbamazepine XR 800mg  BID PTA with reported last dose on 12/25. Carbamazepine dose on admission was elevated at 17. Scr elevated at 4.17 on admission. Carbamazepine dose is not recommend for adjustment unless CrCl < 10 ml/min. MD consulted pharmacy to f/u carbamazepine and resume home dose when level is within therapeutic range as patient has been on this dose for quite some time.   Typical therapeutic levels for epilepsy is 4 -12 mcg/mL BUT if other anticonvulsants are given then therapeutic range is 4-8 mcg/mL. Patient is currently on Keppra so aiming for level of 4-8 mcg/mL   Note there is a drug-drug interaction with PTA acetazolamide but pt is on this combo chronically.  On 12/26, level in AM = 14.  Today, level is 5 mcg/mL, within therapeutic range. Scr improved to 2.5  Plan: Resume carbamazepine XR 800 mg BID as instructed by MD, next dose due at noon. Recommend to monitor carbamazepine level outpatient as not sure what led to rise in admission carbamazapine level with the exception of possible renal injury. Pharmacy will sign off.   Geoffry Paradise, PharmD, BCPS Pager: [phone] 10:41 AM Pharmacy #: [phone]

## 2013-11-01 NOTE — Progress Notes (Signed)
CRITICAL VALUE ALERT  Critical value received:  K- 2.4  Date of notification:  11/01/2013  Time of notification:  1115  Critical value read back:yes  Nurse who received alert:  Ernst Breach RN  MD notified (1st page):  Dr. Ardyth Harps   Time of first page:  1130  MD notified (2nd page):  Time of second page:  Responding MD:  Dr. Ardyth Harps   Time MD responded:  1130

## 2013-11-01 NOTE — Progress Notes (Signed)
[facility] PROGRESS NOTE  Blake Richards ZOX:[id number] DOB: [birth year] DOA: 10/30/2013 PCP: No primary provider on file.  Assessment/Plan: Acute Encephalopathy -Resolved. -Back to baseline. -Suspect related to severe dehydration and sedating medications.  N/V/D -Suspect 2/2 viral gastroenteritis. -Improving.  Hypokalemia -K remains low today at 2.4. -Mag ok at 2.3. -Continue to replete PO. -2/2 severe GI losses.   ARF/Metabolic Acidosis -Improving with IVF. -Cr was 4.17 on admission, now down to 1.48.  Seizure Disorder -Continue home meds.  Leukocytosis -2/2 viral gastroenteritis. -Improving.  Code Status: Full Code Family Communication: Caregiver at bedside  Disposition Plan: Home when stable, likely 24-48 hours, once k has been repleted.   Consultants:  None   Antibiotics:  None   Subjective: No complaints. Feels well.  Objective: Filed Vitals:   10/31/13 1422 10/31/13 2020 11/01/13 0455 11/01/13 1345  BP: 141/79 145/73 138/76 147/81  Pulse: 81 87 77 80  Temp: 97.9 F (36.6 C) 98.3 F (36.8 C) 98.4 F (36.9 C) 98.6 F (37 C)  TempSrc: Oral Axillary Axillary Axillary  Resp: 20 20 20 20   Height:      Weight:      SpO2: 99% 100% 99% 100%    Intake/Output Summary (Last 24 hours) at 11/01/13 1607 Last data filed at 11/01/13 1300  Gross per 24 hour  Intake   1120 ml  Output    725 ml  Net    395 ml   Filed Weights   10/30/13 1730  Weight: 96.163 kg (212 lb)    Exam:   General:  AA Ox3  Cardiovascular: RRR  Respiratory: CTA B  Abdomen: S/NT/ND/+BS  Extremities: no C/C/E/+pedal pulses   Neurologic:  Non-focal.  Data Reviewed: Basic Metabolic Panel:  Recent Labs Lab 10/30/13 1343 10/31/13 0407 11/01/13 0905  NA 134* 133* 133*  K 2.2* 2.3* 2.4*  CL 95* 102 102  CO2 16* 16* 18*  GLUCOSE 171* 122* 131*  BUN 97* 73* 34*  CREATININE 4.17* 2.50* 1.48*  CALCIUM 8.3* 7.3* 8.1*  MG 2.3  --   --    Liver Function  Tests:  Recent Labs Lab 10/30/13 1343  AST 19  ALT 67*  ALKPHOS 131*  BILITOT 0.5  PROT 7.4  ALBUMIN 3.2*    Recent Labs Lab 10/30/13 1343  LIPASE 24   No results found for this basename: AMMONIA,  in the last 168 hours CBC:  Recent Labs Lab 10/30/13 1343 10/31/13 0407  WBC 18.2* 10.8*  HGB 16.9 13.5  HCT 46.2 37.8*  MCV 85.1 85.9  PLT 297 271   Cardiac Enzymes: No results found for this basename: CKTOTAL, CKMB, CKMBINDEX, TROPONINI,  in the last 168 hours BNP (last 3 results) No results found for this basename: PROBNP,  in the last 8760 hours CBG: No results found for this basename: GLUCAP,  in the last 168 hours  No results found for this or any previous visit (from the past 240 hour(s)).   Studies: No results found.  Scheduled Meds: . acetaZOLAMIDE  250 mg Oral BID  . carbamazepine  800 mg Oral BID  . enoxaparin (LOVENOX) injection  40 mg Subcutaneous Q24H  . levETIRAcetam  500 mg Oral QPM  . levETIRAcetam  750 mg Oral Daily  . potassium chloride  40 mEq Oral Q4H  . simvastatin  40 mg Oral q morning - 10a  . sodium chloride  3 mL Intravenous Q12H  . thyroid  60 mg Oral Daily  . triamterene-hydrochlorothiazide  1 tablet Oral  Daily  . venlafaxine  75 mg Oral Daily   Continuous Infusions: . sodium chloride 100 mL/hr at 10/31/13 0420  . sodium chloride 100 mL/hr at 11/01/13 4540    Principal Problem:   Acute encephalopathy Active Problems:   Hypokalemia   Nausea with vomiting   Diarrhea   ARF (acute renal failure)   Leukocytosis, unspecified   Metabolic acidosis    Time spent: 25 minutes. Greater than 50% of this time was spent in direct contact with the patient coordinating care.    Chaya Jan  [facility] Pager [phone]  If 7PM-7AM, please contact night-coverage at [email or web address], password [facility] 11/01/2013, 4:07 PM  LOS: 2 days

## 2013-11-02 DIAGNOSIS — E872 Acidosis: Secondary | ICD-10-CM

## 2013-11-02 LAB — CBC
MCHC: 34.6 g/dL (ref 30.0–36.0)
RDW: 13.6 % (ref 11.5–15.5)
WBC: 6.6 10*3/uL (ref 4.0–10.5)

## 2013-11-02 LAB — BASIC METABOLIC PANEL
BUN: 21 mg/dL (ref 6–23)
BUN: 18 mg/dL (ref 6–23)
CO2: 19 mEq/L (ref 19–32)
Calcium: 8.1 mg/dL — ABNORMAL LOW (ref 8.4–10.5)
Chloride: 106 mEq/L (ref 96–112)
Creatinine, Ser: 1.39 mg/dL — ABNORMAL HIGH (ref 0.50–1.35)
GFR calc Af Amer: 64 mL/min — ABNORMAL LOW (ref >90)
GFR calc Af Amer: 67 mL/min — ABNORMAL LOW (ref >90)
GFR calc non Af Amer: 55 mL/min — ABNORMAL LOW (ref >90)
GFR calc non Af Amer: 58 mL/min — ABNORMAL LOW (ref >90)
Potassium: 2.6 mEq/L — CL (ref 3.5–5.1)
Potassium: 3.2 mEq/L — ABNORMAL LOW (ref 3.5–5.1)
Sodium: 136 mEq/L (ref 135–145)

## 2013-11-02 MED ORDER — SODIUM CHLORIDE 0.9 % IV SOLN
INTRAVENOUS | Status: DC
  Administered 2013-11-02: 15:00:00 via INTRAVENOUS
  Filled 2013-11-02 (×2): qty 1000

## 2013-11-02 MED ORDER — SODIUM CHLORIDE 0.9 % IV SOLN
INTRAVENOUS | Status: DC
  Filled 2013-11-02 (×2): qty 1000

## 2013-11-02 MED ORDER — POTASSIUM CHLORIDE 10 MEQ/100ML IV SOLN
10.0000 meq | INTRAVENOUS | Status: AC
  Administered 2013-11-02 (×4): 10 meq via INTRAVENOUS
  Filled 2013-11-02 (×4): qty 100

## 2013-11-02 MED ORDER — POTASSIUM CHLORIDE IN NACL 20-0.9 MEQ/L-% IV SOLN
INTRAVENOUS | Status: DC
  Administered 2013-11-02 – 2013-11-04 (×4): via INTRAVENOUS
  Filled 2013-11-02 (×6): qty 1000

## 2013-11-02 MED ORDER — POTASSIUM CHLORIDE CRYS ER 20 MEQ PO TBCR
40.0000 meq | EXTENDED_RELEASE_TABLET | Freq: Two times a day (BID) | ORAL | Status: DC
  Administered 2013-11-02 – 2013-11-03 (×4): 40 meq via ORAL
  Filled 2013-11-02 (×6): qty 2

## 2013-11-02 NOTE — Progress Notes (Signed)
[facility] PROGRESS NOTE  Blake Richards ZOX:[id number] DOB: 02/17/1956 DOA: 10/30/2013 PCP: No primary provider on file.  Assessment/Plan: Acute Encephalopathy  -improved. -near baseline per pt's care taker  -Suspect related to severe dehydration and sedating medications.  N/V/D  -Suspect 2/2 viral gastroenteritis.  -Improving Hypokalemia  -K remains low today at 2.6 -Recheck magnesium -Repeat BMP today -Continue to replete PO.  -2/2 severe GI losses.  -Add potassium to maintenance fluids ARF/Metabolic Acidosis  -Improving with IVF.  -Cr was 4.17 on admission, now down to 1.39 -Metabolic acidosis partly due to Diamox  Seizure Disorder  -Continue home meds.  -No seizure activity during this admission  Leukocytosis  -2/2 viral gastroenteritis.  -Improving.  Code Status: Full Code  Family Communication: Caregiver at bedside  Disposition Plan: Home when stable, likely 24 hours, once k has been repleted.      Procedures/Studies: Dg Abd Acute W/chest  10/30/2013   CLINICAL DATA:  Nausea vomiting and diarrhea.  EXAM: ACUTE ABDOMEN SERIES (ABDOMEN 2 VIEW & CHEST 1 VIEW)  COMPARISON:  None.  FINDINGS: There is a a scar versus platelike atelectasis noted in the right lung base.  The bowel gas pattern is abnormal. Within the left lower quadrant there are dilated loops of small bowel measuring up to 5.1 cm. Gas is noted within the colon up to the level of the rectum. Multiple small and large bowel fluid levels are identified.  IMPRESSION: 1. Abnormal bowel gas pattern which is nonspecific. Although the left lower quadrant small bowel loops are dilated there is gas seen throughout the colon up to the level of the rectum. Serial abdominal radiographs advised. If the patient's symptoms worsen or persist then consider further assessment with CT of the abdomen pelvis with oral contrast.   Electronically Signed   By: Signa Kell M.D.   On: 10/30/2013 15:19          Subjective: Patient denies fevers, chills, chest discomfort, shortness breath, nausea, vomiting, diarrhea. He is eating well. No abdominal pain. No dysuria.   Objective: Filed Vitals:   11/01/13 1345 11/01/13 2056 11/02/13 0527 11/02/13 1433  BP: 147/81 150/80 156/80 133/79  Pulse: 80 85 85 80  Temp: 98.6 F (37 C) 99 F (37.2 C) 97.6 F (36.4 C) 98.1 F (36.7 C)  TempSrc: Axillary Oral Oral Oral  Resp: 20 16 16 16   Height:      Weight:      SpO2: 100% 100% 98% 99%    Intake/Output Summary (Last 24 hours) at 11/02/13 1521 Last data filed at 11/02/13 1300  Gross per 24 hour  Intake 3073.33 ml  Output      0 ml  Net 3073.33 ml   Weight change:  Exam:   General:  Pt is alert, follows commands appropriately, not in acute distress  HEENT: No icterus, No thrush, No meningismus, [place]/AT  Cardiovascular: RRR, S1/S2, no rubs, no gallops  Respiratory: CTA bilaterally, no wheezing, no crackles, no rhonchi  Abdomen: Soft/+BS, non tender, non distended, no guarding  Extremities: No edema, No lymphangitis, No petechiae, No rashes, no synovitis  Data Reviewed: Basic Metabolic Panel:  Recent Labs Lab 10/30/13 1343 10/31/13 0407 11/01/13 0905 11/02/13 0502  NA 134* 133* 133* 134*  K 2.2* 2.3* 2.4* 2.6*  CL 95* 102 102 104  CO2 16* 16* 18* 19  GLUCOSE 171* 122* 131* 104*  BUN 97* 73* 34* 21  CREATININE 4.17* 2.50* 1.48* 1.39*  CALCIUM 8.3* 7.3* 8.1* 7.9*  MG 2.3  --   --   --  Liver Function Tests:  Recent Labs Lab 10/30/13 1343  AST 19  ALT 67*  ALKPHOS 131*  BILITOT 0.5  PROT 7.4  ALBUMIN 3.2*    Recent Labs Lab 10/30/13 1343  LIPASE 24   No results found for this basename: AMMONIA,  in the last 168 hours CBC:  Recent Labs Lab 10/30/13 1343 10/31/13 0407 11/02/13 0502  WBC 18.2* 10.8* 6.6  HGB 16.9 13.5 11.5*  HCT 46.2 37.8* 33.2*  MCV 85.1 85.9 86.9  PLT 297 271 282   Cardiac Enzymes: No results found for this basename:  CKTOTAL, CKMB, CKMBINDEX, TROPONINI,  in the last 168 hours BNP: No components found with this basename: POCBNP,  CBG: No results found for this basename: GLUCAP,  in the last 168 hours  No results found for this or any previous visit (from the past 240 hour(s)).   Scheduled Meds: . acetaZOLAMIDE  250 mg Oral BID  . carbamazepine  800 mg Oral BID  . enoxaparin (LOVENOX) injection  40 mg Subcutaneous Q24H  . levETIRAcetam  500 mg Oral QPM  . levETIRAcetam  750 mg Oral Daily  . potassium chloride  40 mEq Oral BID  . simvastatin  40 mg Oral q morning - 10a  . sodium chloride  3 mL Intravenous Q12H  . thyroid  60 mg Oral Daily  . triamterene-hydrochlorothiazide  1 tablet Oral Daily  . venlafaxine  75 mg Oral Daily   Continuous Infusions: . sodium chloride 0.9 % 1,000 mL with potassium chloride 20 mEq infusion 100 mL/hr at 11/02/13 1430     Karrah Mangini, Dar[place], DO  [facility] Pager [phone]  If 7PM-7AM, please contact night-coverage [email or web address] Password [email or web address] 11/02/2013, 3:21 PM   LOS: 3 days

## 2013-11-02 NOTE — Progress Notes (Signed)
K+ is 2.6 this morning.  Continues to have diarrhea but it is less urgent and a lesser amount now.  Lenny Pastel NP is on call and notified.  Awaiting response

## 2013-11-03 DIAGNOSIS — R112 Nausea with vomiting, unspecified: Secondary | ICD-10-CM

## 2013-11-03 LAB — MAGNESIUM: Magnesium: 1.7 mg/dL (ref 1.5–2.5)

## 2013-11-03 LAB — BASIC METABOLIC PANEL
BUN: 15 mg/dL (ref 6–23)
GFR calc Af Amer: 68 mL/min — ABNORMAL LOW (ref >90)
GFR calc non Af Amer: 58 mL/min — ABNORMAL LOW (ref >90)
Potassium: 3 mEq/L — ABNORMAL LOW (ref 3.5–5.1)
Sodium: 136 mEq/L (ref 135–145)

## 2013-11-03 LAB — CLOSTRIDIUM DIFFICILE BY PCR: Toxigenic C. Difficile by PCR: NEGATIVE

## 2013-11-03 LAB — TROPONIN I
Troponin I: <0.3 ng/mL (ref <0.30)
Troponin I: <0.3 ng/mL (ref <0.30)

## 2013-11-03 MED ORDER — CHOLESTYRAMINE 4 G PO PACK
4.0000 g | PACK | Freq: Four times a day (QID) | ORAL | Status: DC
  Administered 2013-11-03 (×2): 4 g via ORAL
  Filled 2013-11-03 (×6): qty 1

## 2013-11-03 MED ORDER — POTASSIUM CHLORIDE 10 MEQ/100ML IV SOLN
10.0000 meq | INTRAVENOUS | Status: AC
  Administered 2013-11-03 (×3): 10 meq via INTRAVENOUS
  Filled 2013-11-03 (×3): qty 100

## 2013-11-03 NOTE — Evaluation (Signed)
Physical Therapy Evaluation Patient Details Name: Blake Richards MRN: [id number] DOB: 12-06-1955 Today's Date: 11/03/2013 Time: 2440-1027 PT Time Calculation (min): 13 min  PT Assessment / Plan / Recommendation History of Present Illness  Patient is a 57 year old white man with history of epilepsy as well as autism was fairly independent at baseline. His caretaker, who is the husband of patient's cousin, is present and provides most of the history as patient is currently quite lethargic. He states that since Monday (4 days prior to admission) he started experiencing projectile vomiting and explosive diarrhea. He went to his PCPs office where a flu swab reportedly was negative. He was prescribed Phenergan which seemed to control the nausea. This morning the caretaker was unable to wake patient up. He finally opened his eyes he was very groggy and fell as they were trying to walk down the stairs. He decided to bring him into the hospital for evaluation where he is found to have a potassium of 2.2 a creatinine of 4.17. We have been asked to admit him for further evaluation and management.  Clinical Impression  Pt  Tolerated very well. Pt's caregiver reports that pt will return hoe. Pt will benefit from PT to address problems.     PT Assessment  Patient needs continued PT services    Follow Up Recommendations  No PT follow up    Does the patient have the potential to tolerate intense rehabilitation      Barriers to Discharge        Equipment Recommendations  None recommended by PT    Recommendations for Other Services     Frequency Min 3X/week    Precautions / Restrictions           Mobility  Bed Mobility Bed Mobility: Supine to Sit;Sit to Supine Supine to Sit: 7: Independent Sit to Supine: 7: Independent Transfers Transfers: Sit to Stand;Stand to Sit Sit to Stand: 5: Supervision Stand to Sit: 5: Supervision Ambulation/Gait Ambulation/Gait Assistance: 5: Supervision;4: Min  guard Ambulation Distance (Feet): 400 Feet Assistive device: None Gait Pattern: Within Functional Limits    Exercises     PT Diagnosis: Generalized weakness  PT Problem List: Decreased activity tolerance;Decreased mobility PT Treatment Interventions: Gait training;Stair training     PT Goals(Current goals can be found in the care plan section) Acute Rehab PT Goals Patient Stated Goal: per caregiver to walk and go home. PT Goal Formulation: With family Time For Goal Achievement: 11/10/13 Potential to Achieve Goals: Good  Visit Information  Last PT Received On: 11/03/13 Assistance Needed: +1 History of Present Illness: Patient is a 57 year old white man with history of epilepsy as well as autism was fairly independent at baseline. His caretaker, who is the husband of patient's cousin, is present and provides most of the history as patient is currently quite lethargic. He states that since Monday (4 days prior to admission) he started experiencing projectile vomiting and explosive diarrhea. He went to his PCPs office where a flu swab reportedly was negative. He was prescribed Phenergan which seemed to control the nausea. This morning the caretaker was unable to wake patient up. He finally opened his eyes he was very groggy and fell as they were trying to walk down the stairs. He decided to bring him into the hospital for evaluation where he is found to have a potassium of 2.2 a creatinine of 4.17. We have been asked to admit him for further evaluation and management.       Prior Functioning  Home Living Family/patient expects to be discharged to:: Private residence Living Arrangements: Other relatives Available Help at Discharge: Family Type of Home: House Home Access: Stairs to enter Secretary/administrator of Steps: 7 Entrance Stairs-Rails: None Home Layout: Two level Alternate Level Stairs-Number of Steps: 14 Alternate Level Stairs-Rails: Right Home Equipment: None Prior  Function Level of Independence: Independent Comments: but has supervision Communication Communication: Expressive difficulties;Receptive difficulties    Cognition  Cognition Arousal/Alertness: Awake/alert Behavior During Therapy: Impulsive Overall Cognitive Status: History of cognitive impairments - at baseline    Extremity/Trunk Assessment Upper Extremity Assessment Upper Extremity Assessment: Overall WFL for tasks assessed Lower Extremity Assessment Lower Extremity Assessment: Overall WFL for tasks assessed Cervical / Trunk Assessment Cervical / Trunk Assessment: Normal   Balance    End of Session PT - End of Session Activity Tolerance: Patient tolerated treatment well Patient left: with call bell/phone within reach;with family/visitor present Nurse Communication: Mobility status  GP     Rada Hay 11/03/2013, 4:26 PM .Blanchard Kelch PT [phone]

## 2013-11-03 NOTE — Progress Notes (Signed)
Patient complained of some mild chest pain this am. EKG done and revealed NSR. VS stable. MD was notified and new orders were placed. Will continue to monitor patient.  Setzer, Don Broach

## 2013-11-03 NOTE — Progress Notes (Addendum)
[facility] PROGRESS NOTE  Saxon Barich ZOX:[id number] DOB: 1956-08-15 DOA: 10/30/2013 PCP: No primary provider on file.  Assessment/Plan: Acute Encephalopathy  -improved.  -near baseline per pt's care taker  -Suspect related to severe dehydration and sedating medications.  N/V/D  -Suspect 2/2 viral gastroenteritis.  -Improving  -check C. difficile PCR today--->neg -add questran to slow stool Hypokalemia  -K slowly improving--3.0 today  -Recheck magnesium--1.7  -Continue to replete PO and IV.  -2/2 severe GI losses.  -Add potassium to maintenance fluids  ARF/Metabolic Acidosis  -Improving with IVF.  -Cr was 4.17 on admission, now down to 1.32  -Metabolic acidosis partly due to Diamox  Seizure Disorder  -Continue home meds.  -No seizure activity during this admission  Leukocytosis  -2/2 viral gastroenteritis.  -Improving.  Deconditioning  -Physical therapy was consulted  Code Status: Full Code  Family Communication: Caregiver at bedside  Disposition Plan: Home when stable, likely 24 hours, once k has been repleted.          Procedures/Studies: Dg Abd Acute W/chest  10/30/2013   CLINICAL DATA:  Nausea vomiting and diarrhea.  EXAM: ACUTE ABDOMEN SERIES (ABDOMEN 2 VIEW & CHEST 1 VIEW)  COMPARISON:  None.  FINDINGS: There is a a scar versus platelike atelectasis noted in the right lung base.  The bowel gas pattern is abnormal. Within the left lower quadrant there are dilated loops of small bowel measuring up to 5.1 cm. Gas is noted within the colon up to the level of the rectum. Multiple small and large bowel fluid levels are identified.  IMPRESSION: 1. Abnormal bowel gas pattern which is nonspecific. Although the left lower quadrant small bowel loops are dilated there is gas seen throughout the colon up to the level of the rectum. Serial abdominal radiographs advised. If the patient's symptoms worsen or persist then consider further assessment with CT of the abdomen  pelvis with oral contrast.   Electronically Signed   By: Signa Kell M.D.   On: 10/30/2013 15:19         Subjective: Patient continues to have at least 5-6 loose stools per day but the volume is decreasing. Denies chest pain, shortness breath, vomiting, abdominal pain, dysuria, hematuria. No headaches or rashes. No fevers or chills.  Objective: Filed Vitals:   11/02/13 1433 11/02/13 2038 11/03/13 0402 11/03/13 1348  BP: 133/79 130/79 140/90 134/88  Pulse: 80 87 94 94  Temp: 98.1 F (36.7 C) 98.7 F (37.1 C) 99 F (37.2 C) 98.3 F (36.8 C)  TempSrc: Oral Axillary Axillary Axillary  Resp: 16 20 16 18   Height:      Weight:      SpO2: 99% 99% 99% 99%    Intake/Output Summary (Last 24 hours) at 11/03/13 1702 Last data filed at 11/03/13 1400  Gross per 24 hour  Intake 4123.33 ml  Output      0 ml  Net 4123.33 ml   Weight change:  Exam:   General:  Pt is alert, follows commands appropriately, not in acute distress  HEENT: No icterus, No thrush,  [place]/AT  Cardiovascular: RRR, S1/S2, no rubs, no gallops  Respiratory: CTA bilaterally, no wheezing, no crackles, no rhonchi  Abdomen: Soft/+BS, non tender, non distended, no guarding  Extremities: trace LE edema, No lymphangitis, No petechiae, No rashes, no synovitis  Data Reviewed: Basic Metabolic Panel:  Recent Labs Lab 10/30/13 1343 10/31/13 0407 11/01/13 0905 11/02/13 0502 11/02/13 1430 11/03/13 0449  NA 134* 133* 133* 134* 136 136  K 2.2*  2.3* 2.4* 2.6* 3.2* 3.0*  CL 95* 102 102 104 106 106  CO2 16* 16* 18* 19 19 18*  GLUCOSE 171* 122* 131* 104* 100* 95  BUN 97* 73* 34* 21 18 15   CREATININE 4.17* 2.50* 1.48* 1.39* 1.33 1.32  CALCIUM 8.3* 7.3* 8.1* 7.9* 8.1* 8.2*  MG 2.3  --   --   --   --  1.7   Liver Function Tests:  Recent Labs Lab 10/30/13 1343  AST 19  ALT 67*  ALKPHOS 131*  BILITOT 0.5  PROT 7.4  ALBUMIN 3.2*    Recent Labs Lab 10/30/13 1343  LIPASE 24   No results found for this  basename: AMMONIA,  in the last 168 hours CBC:  Recent Labs Lab 10/30/13 1343 10/31/13 0407 11/02/13 0502  WBC 18.2* 10.8* 6.6  HGB 16.9 13.5 11.5*  HCT 46.2 37.8* 33.2*  MCV 85.1 85.9 86.9  PLT 297 271 282   Cardiac Enzymes:  Recent Labs Lab 11/03/13 1015 11/03/13 1604  TROPONINI <0.30 <0.30   BNP: No components found with this basename: POCBNP,  CBG: No results found for this basename: GLUCAP,  in the last 168 hours  Recent Results (from the past 240 hour(s))  CLOSTRIDIUM DIFFICILE BY PCR     Status: None   Collection Time    11/03/13 10:33 AM      Result Value Range Status   C difficile by pcr NEGATIVE  NEGATIVE Final   Comment: Performed at [facility]     Scheduled Meds: . acetaZOLAMIDE  250 mg Oral BID  . carbamazepine  800 mg Oral BID  . cholestyramine  4 g Oral QID  . enoxaparin (LOVENOX) injection  40 mg Subcutaneous Q24H  . levETIRAcetam  500 mg Oral QPM  . levETIRAcetam  750 mg Oral Daily  . potassium chloride  40 mEq Oral BID  . simvastatin  40 mg Oral q morning - 10a  . sodium chloride  3 mL Intravenous Q12H  . thyroid  60 mg Oral Daily  . triamterene-hydrochlorothiazide  1 tablet Oral Daily  . venlafaxine  75 mg Oral Daily   Continuous Infusions: . 0.9 % NaCl with KCl 20 mEq / L 100 mL/hr at 11/03/13 4540     Burtis Imhoff, [place], DO  [facility] Pager [phone]  If 7PM-7AM, please contact night-coverage [email or web address] Password [email or web address] 11/03/2013, 5:02 PM   LOS: 4 days

## 2013-11-03 NOTE — Discharge Summary (Addendum)
Physician Discharge Summary  Moustafa Mossa ZOX:[id number] DOB: Feb 07, 1956 DOA: 10/30/2013  PCP: No primary provider on file.  Admit date: 10/30/2013 Discharge date: 11/04/13 Recommendations for Outpatient Follow-up:  1. Pt will need to follow up with PCP in 2-3 days post discharge 2. Please obtain BMP to evaluate electrolytes and kidney function 3. Please also check CBC to evaluate Hg and Hct levels   Discharge Diagnoses:  Principal Problem:   Acute encephalopathy Active Problems:   Hypokalemia   Nausea with vomiting   Diarrhea   ARF (acute renal failure)   Leukocytosis, unspecified   Metabolic acidosis Acute Encephalopathy  -improved.  -back to baseline per pt's care taker  -Suspect related to severe dehydration and sedating medications.  N/V/D  -Suspect 2/2 viral gastroenteritis.  -Improving  -C. difficile PCR--neg -tolerated diet prior to d/c -The number and volume of the stools continued to improve -questran was added which helped slow down his stool and bulk his stool Hypokalemia  -K slowly improving--3.4 on day of d/c -Recheck magnesium--1.7 -Continue to replete PO and IV.  -2/2 severe GI losses.  -Add potassium to maintenance fluids  -The patient was advised to follow up with his primary care physician for blood draw in the next 2-3 days -He was sent home with potassium, 20 mEq once daily for 3 days ARF/Metabolic Acidosis  -Improving with IVF.  -Cr was 4.17 on admission -Serum creatinine 1.44 on the day of discharge -Metabolic acidosis partly due to Diamox as well as his diarrhea Seizure Disorder  -Continue home meds.  -No seizure activity during this admission  Leukocytosis  -2/2 viral gastroenteritis.  -Improving.  Deconditioning -Physical therapy was consulted and did not feel that the patient needed additional physical therapy Code Status: Full Code  Family Communication: Caregiver at bedside  Disposition Plan: Home when stable, likely 24 hours, once k  has been repleted.   Discharge Condition: Stable  Disposition:  discharge home with caretaker  Diet: Heart healthy Wt Readings from Last 3 Encounters:  10/30/13 96.163 kg (212 lb)    History of present illness:  57 year old white man with history of epilepsy as well as autism was fairly independent at baseline. His caretaker, who is the husband of patient's cousin, is present and provides most of the history as patient is currently quite lethargic. He states that since Monday 10/27/13 (4 days prior to admission) he started experiencing projectile vomiting and explosive diarrhea. He went to his PCPs office where a flu swab reportedly was negative. He was prescribed Phenergan which seemed to control the nausea. This morning the caretaker was unable to wake patient up. He finally opened his eyes he was very groggy and fell as they were trying to walk down the stairs. He decided to bring him into the hospital for evaluation where he is found to have a potassium of 2.2 a creatinine of 4.17. The patient was started on intravenous fluids. His potassium was replaced with po and IV replacement. Magnesium was checked and low. It was replaced. His renal function gradually improved with fluid resuscitation. The patient's diarrhea gradually decreased in volume; however, Questran was added which helped bulk his stools.. The patient's encephalopathy improved and returned to the baseline. C. difficile PCR was checked and was negative.    Hospital Course:    Discharge Exam: Filed Vitals:   11/04/13 0454  BP: 134/78  Pulse: 103  Temp: 98.4 F (36.9 C)  Resp: 18   Filed Vitals:   11/03/13 0402 11/03/13 1348 11/03/13 2050  11/04/13 0454  BP: 140/90 134/88 141/86 134/78  Pulse: 94 94 105 103  Temp: 99 F (37.2 C) 98.3 F (36.8 C) 98.7 F (37.1 C) 98.4 F (36.9 C)  TempSrc: Axillary Axillary Axillary Axillary  Resp: 16 18 18 18   Height:      Weight:      SpO2: 99% 99% 99% 97%   General: A&O x 3,  NAD, pleasant, cooperative Cardiovascular: RRR, no rub, no gallop, no S3 Respiratory: CTAB, no wheeze, no rhonchi Abdomen:soft, nontender, nondistended, positive bowel sounds Extremities: trace LE edema, No lymphangitis, no petechiae  Discharge Instructions      Discharge Orders   Future Orders Complete By Expires   Increase activity slowly  As directed        Medication List         acetaZOLAMIDE 250 MG tablet  Commonly known as:  DIAMOX  Take 250 mg by mouth 2 (two) times daily.     ALPRAZolam 0.25 MG tablet  Commonly known as:  XANAX  Take 0.25 mg by mouth 2 (two) times daily as needed for anxiety.     benzonatate 100 MG capsule  Commonly known as:  TESSALON  Take 200 mg by mouth 3 (three) times daily as needed for cough.     carbamazepine 400 MG 12 hr tablet  Commonly known as:  TEGRETOL XR  Take 800 mg by mouth 2 (two) times daily.     levETIRAcetam 500 MG tablet  Commonly known as:  KEPPRA  Take 500-750 mg by mouth every 12 (twelve) hours. 750 moring and 500 mg in the evening     potassium chloride SA 20 MEQ tablet  Commonly known as:  K-DUR,KLOR-CON  Take 1 tablet (20 mEq total) by mouth daily.     promethazine 25 MG tablet  Commonly known as:  PHENERGAN  Take 25 mg by mouth every 6 (six) hours as needed for nausea or vomiting.     sertraline 100 MG tablet  Commonly known as:  ZOLOFT  Take 100 mg by mouth daily.     simvastatin 40 MG tablet  Commonly known as:  ZOCOR  Take 40 mg by mouth every morning.     thyroid 60 MG tablet  Commonly known as:  ARMOUR  Take 60 mg by mouth daily.     triamterene-hydrochlorothiazide 37.5-25 MG per capsule  Commonly known as:  DYAZIDE  Take 1 capsule by mouth daily.     venlafaxine 75 MG tablet  Commonly known as:  EFFEXOR  Take 75 mg by mouth daily.         The results of significant diagnostics from this hospitalization (including imaging, microbiology, ancillary and laboratory) are listed below for  reference.    Significant Diagnostic Studies: Dg Abd Acute W/chest  10/30/2013   CLINICAL DATA:  Nausea vomiting and diarrhea.  EXAM: ACUTE ABDOMEN SERIES (ABDOMEN 2 VIEW & CHEST 1 VIEW)  COMPARISON:  None.  FINDINGS: There is a a scar versus platelike atelectasis noted in the right lung base.  The bowel gas pattern is abnormal. Within the left lower quadrant there are dilated loops of small bowel measuring up to 5.1 cm. Gas is noted within the colon up to the level of the rectum. Multiple small and large bowel fluid levels are identified.  IMPRESSION: 1. Abnormal bowel gas pattern which is nonspecific. Although the left lower quadrant small bowel loops are dilated there is gas seen throughout the colon up to the level of the  rectum. Serial abdominal radiographs advised. If the patient's symptoms worsen or persist then consider further assessment with CT of the abdomen pelvis with oral contrast.   Electronically Signed   By: Signa Kell M.D.   On: 10/30/2013 15:19     Microbiology: Recent Results (from the past 240 hour(s))  CLOSTRIDIUM DIFFICILE BY PCR     Status: None   Collection Time    11/03/13 10:33 AM      Result Value Range Status   C difficile by pcr NEGATIVE  NEGATIVE Final   Comment: Performed at [facility]     Labs: Basic Metabolic Panel:  Recent Labs Lab 10/30/13 1343  11/01/13 0905 11/02/13 0502 11/02/13 1430 11/03/13 0449 11/04/13 0450  NA 134*  < > 133* 134* 136 136 137  K 2.2*  < > 2.4* 2.6* 3.2* 3.0* 3.4*  CL 95*  < > 102 104 106 106 105  CO2 16*  < > 18* 19 19 18* 18*  GLUCOSE 171*  < > 131* 104* 100* 95 106*  BUN 97*  < > 34* 21 18 15 12   CREATININE 4.17*  < > 1.48* 1.39* 1.33 1.32 1.44*  CALCIUM 8.3*  < > 8.1* 7.9* 8.1* 8.2* 8.4  MG 2.3  --   --   --   --  1.7 1.6  < > = values in this interval not displayed. Liver Function Tests:  Recent Labs Lab 10/30/13 1343  AST 19  ALT 67*  ALKPHOS 131*  BILITOT 0.5  PROT 7.4  ALBUMIN 3.2*     Recent Labs Lab 10/30/13 1343  LIPASE 24   No results found for this basename: AMMONIA,  in the last 168 hours CBC:  Recent Labs Lab 10/30/13 1343 10/31/13 0407 11/02/13 0502 11/04/13 0450  WBC 18.2* 10.8* 6.6 11.2*  HGB 16.9 13.5 11.5* 11.7*  HCT 46.2 37.8* 33.2* 34.2*  MCV 85.1 85.9 86.9 88.6  PLT 297 271 282 384   Cardiac Enzymes:  Recent Labs Lab 11/03/13 1015 11/03/13 1604  TROPONINI <0.30 <0.30   BNP: No components found with this basename: POCBNP,  CBG: No results found for this basename: GLUCAP,  in the last 168 hours  Time coordinating discharge:  Greater than 30 minutes  Signed:  Becka Lagasse, [place], DO [facility] Pager: [phone] 11/04/2013, 8:24 AM

## 2013-11-04 LAB — CBC
HCT: 34.2 % — ABNORMAL LOW (ref 39.0–52.0)
Hemoglobin: 11.7 g/dL — ABNORMAL LOW (ref 13.0–17.0)
MCH: 30.3 pg (ref 26.0–34.0)
MCHC: 34.2 g/dL (ref 30.0–36.0)
Platelets: 384 10*3/uL (ref 150–400)
RDW: 13.9 % (ref 11.5–15.5)

## 2013-11-04 LAB — BASIC METABOLIC PANEL
BUN: 12 mg/dL (ref 6–23)
CO2: 18 mEq/L — ABNORMAL LOW (ref 19–32)
Calcium: 8.4 mg/dL (ref 8.4–10.5)
Creatinine, Ser: 1.44 mg/dL — ABNORMAL HIGH (ref 0.50–1.35)
GFR calc Af Amer: 61 mL/min — ABNORMAL LOW (ref >90)
GFR calc non Af Amer: 53 mL/min — ABNORMAL LOW (ref >90)
Sodium: 137 mEq/L (ref 137–147)

## 2013-11-04 LAB — MAGNESIUM: Magnesium: 1.6 mg/dL (ref 1.5–2.5)

## 2013-11-04 MED ORDER — POTASSIUM CHLORIDE CRYS ER 20 MEQ PO TBCR: 20.0000 meq | EXTENDED_RELEASE_TABLET | Freq: Every day | ORAL | Status: DC

## 2013-11-04 MED ORDER — POTASSIUM CHLORIDE CRYS ER 20 MEQ PO TBCR
20.0000 meq | EXTENDED_RELEASE_TABLET | Freq: Every day | ORAL | Status: DC
  Filled 2013-11-04: qty 1

## 2014-07-24 ENCOUNTER — Other Ambulatory Visit (HOSPITAL_COMMUNITY): Payer: Self-pay | Admitting: Family Medicine

## 2014-07-24 ENCOUNTER — Ambulatory Visit (HOSPITAL_COMMUNITY)
Admission: RE | Admit: 2014-07-24 | Discharge: 2014-07-24 | Disposition: A | Discharge status: Home / Self Care | Payer: Medicare Other | Source: Ambulatory Visit | Attending: Family Medicine | Admitting: Family Medicine

## 2014-07-24 ENCOUNTER — Other Ambulatory Visit: Payer: Self-pay | Admitting: Interventional Cardiology

## 2014-07-24 DIAGNOSIS — M542 Cervicalgia: Secondary | ICD-10-CM | POA: Diagnosis not present

## 2014-07-24 DIAGNOSIS — Q740 Other congenital malformations of upper limb(s), including shoulder girdle: Secondary | ICD-10-CM | POA: Diagnosis not present

## 2014-10-25 ENCOUNTER — Encounter (HOSPITAL_COMMUNITY): Payer: Self-pay | Admitting: Emergency Medicine

## 2014-10-25 ENCOUNTER — Emergency Department (HOSPITAL_COMMUNITY)
Admission: EM | Admit: 2014-10-25 | Discharge: 2014-10-25 | Disposition: A | Discharge status: Home / Self Care | Payer: Medicare Other | Attending: Emergency Medicine | Admitting: Emergency Medicine

## 2014-10-25 DIAGNOSIS — F840 Autistic disorder: Secondary | ICD-10-CM | POA: Insufficient documentation

## 2014-10-25 DIAGNOSIS — R569 Unspecified convulsions: Secondary | ICD-10-CM

## 2014-10-25 DIAGNOSIS — G40909 Epilepsy, unspecified, not intractable, without status epilepticus: Secondary | ICD-10-CM | POA: Diagnosis not present

## 2014-10-25 DIAGNOSIS — Z79899 Other long term (current) drug therapy: Secondary | ICD-10-CM | POA: Diagnosis not present

## 2014-10-25 DIAGNOSIS — I10 Essential (primary) hypertension: Secondary | ICD-10-CM | POA: Diagnosis not present

## 2014-10-25 LAB — CBC
HCT: 44.7 % (ref 39.0–52.0)
Hemoglobin: 15 g/dL (ref 13.0–17.0)
MCH: 30.6 pg (ref 26.0–34.0)
MCHC: 33.6 g/dL (ref 30.0–36.0)
MCV: 91.2 fL (ref 78.0–100.0)
PLATELETS: 346 10*3/uL (ref 150–400)
RBC: 4.9 MIL/uL (ref 4.22–5.81)
RDW: 13.6 % (ref 11.5–15.5)
WBC: 7.4 10*3/uL (ref 4.0–10.5)

## 2014-10-25 LAB — BASIC METABOLIC PANEL
Anion gap: 15 (ref 5–15)
BUN: 17 mg/dL (ref 6–23)
CO2: 23 mEq/L (ref 19–32)
Calcium: 9.5 mg/dL (ref 8.4–10.5)
Chloride: 102 mEq/L (ref 96–112)
Creatinine, Ser: 1.2 mg/dL (ref 0.50–1.35)
GFR, EST AFRICAN AMERICAN: 75 mL/min — AB (ref >90)
GFR, EST NON AFRICAN AMERICAN: 65 mL/min — AB (ref >90)
GLUCOSE: 109 mg/dL — AB (ref 70–99)
POTASSIUM: 3.3 meq/L — AB (ref 3.7–5.3)
SODIUM: 140 meq/L (ref 137–147)

## 2014-10-25 LAB — CBG MONITORING, ED: GLUCOSE-CAPILLARY: 118 mg/dL — AB (ref 70–99)

## 2014-10-25 LAB — CARBAMAZEPINE LEVEL, TOTAL: CARBAMAZEPINE LVL: 7.9 ug/mL (ref 4.0–12.0)

## 2014-10-25 MED ORDER — SODIUM CHLORIDE 0.9 % IV BOLUS (SEPSIS)
500.0000 mL | Freq: Once | INTRAVENOUS | Status: AC
  Administered 2014-10-25: 500 mL via INTRAVENOUS

## 2014-10-25 NOTE — ED Notes (Signed)
[place] [facility] c/o focal seizures today lasting less than 1 min. Hx of autism and seizures. He takes keppra and tegretol. Pt has been out of  His daily routine the past few days.

## 2014-10-25 NOTE — ED Provider Notes (Signed)
CSN: [id number]     Arrival date & time 10/25/14  1200 History   First MD Initiated Contact with Patient 10/25/14 1221     Chief Complaint  Patient presents with  . Seizures     (Consider location/radiation/quality/duration/timing/severity/associated sxs/prior Treatment) HPI Comments: 58 year old male with history of autism, epilepsy, high blood pressure, takes Tegretol Keppra and Xanax regularly as directed without recent changes [place]. Patient had brief focal seizure similar to previous lasting less than 1 minute. Patient post ictal and mild fatigue Are gradually improving. No recent infections fevers chills. Patient has changed his daily routine recently with a [place] outside of the city which sometimes throws him off with his autism history.  Patient is a 58 y.o. male presenting with seizures. The history is provided by the patient.  Seizures   Past Medical History  Diagnosis Date  . Autism   . Epilepsy   . Hypertension    History reviewed. No pertinent past surgical history. No family history on file. History  Substance Use Topics  . Smoking status: Never Smoker   . Smokeless tobacco: Not on file  . Alcohol Use: No    Review of Systems  Unable to perform ROS: Mental status change  Neurological: Positive for seizures.      Allergies  Lactose intolerance (gi)  Home Medications   Prior to Admission medications   Medication Sig Start Date End Date Taking? Authorizing Provider  acetaZOLAMIDE (DIAMOX) 250 MG tablet Take 250 mg by mouth 2 (two) times daily.   Yes Historical Provider, MD  ALPRAZolam (XANAX) 0.25 MG tablet Take 0.25 mg by mouth 2 (two) times daily as needed for anxiety.    Yes Historical Provider, MD  carbamazepine (TEGRETOL XR) 400 MG 12 hr tablet Take 800 mg by mouth 2 (two) times daily.   Yes Historical Provider, MD  levETIRAcetam (KEPPRA) 500 MG tablet Take 500-750 mg by mouth every 12 (twelve) hours. 750 morning and  500 mg in the evening   Yes Historical Provider, MD  potassium chloride SA (K-DUR,KLOR-CON) 20 MEQ tablet Take 1 tablet (20 mEq total) by mouth daily. 11/04/13  Yes Catarina Hartshornavid Tat, MD  sertraline (ZOLOFT) 100 MG tablet Take 100 mg by mouth daily.   Yes Historical Provider, MD  simvastatin (ZOCOR) 40 MG tablet Take 40 mg by mouth every morning.   Yes Historical Provider, MD  thyroid (ARMOUR) 60 MG tablet Take 60 mg by mouth daily.   Yes Historical Provider, MD  triamterene-hydrochlorothiazide (DYAZIDE) 37.5-25 MG per capsule Take 1 capsule by mouth daily.   Yes Historical Provider, MD  venlafaxine (EFFEXOR) 75 MG tablet Take 75 mg by mouth daily.   Yes Historical Provider, MD   BP 150/77 mmHg  Pulse 80  Temp(Src) 98.1 F (36.7 C) (Oral)  Resp 18  SpO2 95% Physical Exam  Constitutional: He appears well-developed and well-nourished.  HENT:  Head: Normocephalic and atraumatic.  Mild dry mucous membranes  Eyes: Conjunctivae are normal. Right eye exhibits no discharge. Left eye exhibits no discharge.  Neck: Normal range of motion. Neck supple. No tracheal deviation present.  Cardiovascular: Normal rate and regular rhythm.   Pulmonary/Chest: Effort normal and breath sounds normal.  Abdominal: Soft. He exhibits no distension. There is no tenderness. There is no guarding.  Musculoskeletal: He exhibits no edema.  Neurological: He is alert.  Patient alert and follows commands, mild drowsy but easily arousable to verbal stimulation. Mild post ictal presentation clinically. Neck supple no meningismus  full range of motion. Patient moves all extremities equal with 5+ strength bilateral. Pupils equal bilateral eczema the muscle function intact.  Skin: Skin is warm. No rash noted.  Psychiatric: He has a normal mood and affect.  Nursing note and vitals reviewed.   ED Course  Procedures (including critical care time) Labs Review Labs Reviewed  BASIC METABOLIC PANEL - Abnormal; Notable for the following:     Potassium 3.3 (*)    Glucose, Bld 109 (*)    GFR calc non Af Amer 65 (*)    GFR calc Af Amer 75 (*)    All other components within normal limits  CBG MONITORING, ED - Abnormal; Notable for the following:    Glucose-Capillary 118 (*)    All other components within normal limits  CBC  CARBAMAZEPINE LEVEL, TOTAL    Imaging Review No results found.   EKG Interpretation None      MDM   Final diagnoses:  Seizure    Patient with history of seizures on seizure medicines, no change in medicines, no recent infections, patient gradually improving to baseline in the ER. Plan for closer observation, screening blood work and if patient continues to improve to baseline close outpatient follow-up discussed.  Patient returned to baseline the ER no seizure activity. Patient caregiver comfortable with outpatient follow-up.  Results and differential diagnosis were discussed with the patient/parent/guardian. Close follow up outpatient was discussed, comfortable with the plan.   Medications  sodium chloride 0.9 % bolus 500 mL (0 mLs Intravenous Stopped 10/25/14 1509)    Filed Vitals:   10/25/14 1208 10/25/14 1417  BP: 151/94 150/77  Pulse: 91 80  Temp: 98.1 F (36.7 C)   TempSrc: Oral   Resp: 18 18  SpO2: 95% 95%    Final diagnoses:  Seizure      Enid SkeensJoshua M Bertran Zeimet, MD 10/25/14 1540

## 2014-10-25 NOTE — ED Notes (Signed)
Pt ambulated to restroom with steady.

## 2014-10-25 NOTE — Discharge Instructions (Signed)
If you were given medicines take as directed.  If you are on coumadin or contraceptives realize their levels and effectiveness is altered by many different medicines.  If you have any reaction (rash, tongues swelling, other) to the medicines stop taking and see a physician.   Please follow up as directed and return to the ER or see a physician for new or worsening symptoms.  Thank you. Filed Vitals:   10/25/14 1208 10/25/14 1417  BP: 151/94 150/77  Pulse: 91 80  Temp: 98.1 F (36.7 C)   TempSrc: Oral   Resp: 18 18  SpO2: 95% 95%

## 2014-10-25 NOTE — ED Notes (Signed)
Bed: [id number] Expected date: 10/25/14 Expected time: 12:01 PM Means of arrival:  Comments: EMS - seizure

## 2015-09-06 DIAGNOSIS — G40909 Epilepsy, unspecified, not intractable, without status epilepticus: Secondary | ICD-10-CM | POA: Insufficient documentation

## 2015-09-06 DIAGNOSIS — F840 Autistic disorder: Secondary | ICD-10-CM | POA: Insufficient documentation

## 2015-10-06 DIAGNOSIS — E039 Hypothyroidism, unspecified: Secondary | ICD-10-CM | POA: Insufficient documentation

## 2017-04-21 ENCOUNTER — Emergency Department (HOSPITAL_COMMUNITY)
Admission: EM | Admit: 2017-04-21 | Discharge: 2017-04-21 | Discharge status: Against Medical Advice | Payer: Medicare Other | Attending: Emergency Medicine | Admitting: Emergency Medicine

## 2017-04-21 ENCOUNTER — Encounter (HOSPITAL_COMMUNITY): Payer: Self-pay | Admitting: Emergency Medicine

## 2017-04-21 DIAGNOSIS — F840 Autistic disorder: Secondary | ICD-10-CM | POA: Diagnosis not present

## 2017-04-21 DIAGNOSIS — R51 Headache: Secondary | ICD-10-CM | POA: Diagnosis present

## 2017-04-21 DIAGNOSIS — I10 Essential (primary) hypertension: Secondary | ICD-10-CM | POA: Diagnosis not present

## 2017-04-21 DIAGNOSIS — R519 Headache, unspecified: Secondary | ICD-10-CM

## 2017-04-21 DIAGNOSIS — Z79899 Other long term (current) drug therapy: Secondary | ICD-10-CM | POA: Insufficient documentation

## 2017-04-21 LAB — CBC WITH DIFFERENTIAL/PLATELET
BASOS ABS: 0 10*3/uL (ref 0.0–0.1)
BASOS PCT: 1 %
Eosinophils Absolute: 0.2 10*3/uL (ref 0.0–0.7)
Eosinophils Relative: 4 %
HEMATOCRIT: 38.1 % — AB (ref 39.0–52.0)
HEMOGLOBIN: 12.3 g/dL — AB (ref 13.0–17.0)
LYMPHS PCT: 29 %
Lymphs Abs: 1.3 10*3/uL (ref 0.7–4.0)
MCH: 29.9 pg (ref 26.0–34.0)
MCHC: 32.3 g/dL (ref 30.0–36.0)
MCV: 92.5 fL (ref 78.0–100.0)
Monocytes Absolute: 0.4 10*3/uL (ref 0.1–1.0)
Monocytes Relative: 9 %
NEUTROS ABS: 2.5 10*3/uL (ref 1.7–7.7)
NEUTROS PCT: 57 %
Platelets: 302 10*3/uL (ref 150–400)
RBC: 4.12 MIL/uL — AB (ref 4.22–5.81)
RDW: 14 % (ref 11.5–15.5)
WBC: 4.4 10*3/uL (ref 4.0–10.5)

## 2017-04-21 LAB — BASIC METABOLIC PANEL
ANION GAP: 5 (ref 5–15)
BUN: 10 mg/dL (ref 6–20)
CALCIUM: 8.6 mg/dL — AB (ref 8.9–10.3)
CO2: 26 mmol/L (ref 22–32)
Chloride: 103 mmol/L (ref 101–111)
Creatinine, Ser: 1.01 mg/dL (ref 0.61–1.24)
GFR calc non Af Amer: >60 mL/min (ref >60)
GLUCOSE: 81 mg/dL (ref 65–99)
Potassium: 3.7 mmol/L (ref 3.5–5.1)
Sodium: 134 mmol/L — ABNORMAL LOW (ref 135–145)

## 2017-04-21 LAB — TSH: TSH: 0.705 u[IU]/mL (ref 0.350–4.500)

## 2017-04-21 LAB — MAGNESIUM: MAGNESIUM: 1.9 mg/dL (ref 1.7–2.4)

## 2017-04-21 MED ORDER — ACETAMINOPHEN 325 MG PO TABS
650.0000 mg | ORAL_TABLET | Freq: Once | ORAL | Status: AC
  Administered 2017-04-21: 650 mg via ORAL
  Filled 2017-04-21: qty 2

## 2017-04-21 NOTE — ED Notes (Signed)
Pt's friends/visitors agitated, stating "we're going to leave. We've been here since 12, and I'm working overtime right now, we're going home!". This RN explained that leaving would be against medical advice and that we were concerned about the pt's low heart rate. Visitors still adamant about leaving, stated "they don't care." Dr. Denton LankSteinl notified, pt left and visitors left AMA without getting CT of head.

## 2017-04-21 NOTE — ED Triage Notes (Signed)
No neuro deficits noted.

## 2017-04-21 NOTE — ED Notes (Signed)
Pt given [place] sandwich and sprite per Anna(RN)

## 2017-04-21 NOTE — ED Provider Notes (Signed)
[facility]-EMERGENCY DEPT Provider Note   CSN: [id number] Arrival date & time: 04/21/17  1240     History   Chief Complaint Chief Complaint  Patient presents with  . Headache    HPI Blake Richards is a 61 y.o. male.  HPI  Patient is a 61 year old male with past medical history significant for migraines, autism, hypertension, who presents to the emergency department with 1 day history of mild headache. Describes dull, frontal headache, nonradiating. Improved with rest and Motrin. Light worsened his headache. Patient states that a group home, they checked his blood pressure earlier today, noted that it was elevated, 180s/90s. They were concerned that something "bad was going on" so they took him to the emergency department. Not currently on any antihypertensive medications. No recent falls or head trauma. Denies fever, chills, nausea, vomiting, change in vision, chest pain, shortness of breath, extremity numbness or weakness. Patient states that he is no longer having a headache and feels much better.  Past Medical History:  Diagnosis Date  . Autism   . Epilepsy (HCC)   . Hypertension     Patient Active Problem List   Diagnosis Date Noted  . Hypokalemia 10/30/2013  . Nausea with vomiting 10/30/2013  . Diarrhea 10/30/2013  . ARF (acute renal failure) (HCC) 10/30/2013  . Acute encephalopathy 10/30/2013  . Leukocytosis, unspecified 10/30/2013  . Metabolic acidosis 10/30/2013    History reviewed. No pertinent surgical history.     Home Medications    Prior to Admission medications   Medication Sig Start Date End Date Taking? Authorizing Provider  acetaZOLAMIDE (DIAMOX) 250 MG tablet Take 250 mg by mouth 2 (two) times daily.    [provider]  ALPRAZolam Prudy Feeler) 0.25 MG tablet Take 0.25 mg by mouth 2 (two) times daily as needed for anxiety.     [provider]  carbamazepine (TEGRETOL XR) 400 MG 12 hr tablet Take 800 mg by mouth 2 (two) times daily.    [provider]  levETIRAcetam (KEPPRA) 500 MG tablet Take 500-750 mg by mouth every 12 (twelve) hours. 750 morning and 500 mg in the evening    [provider]  potassium chloride SA (K-DUR,KLOR-CON) 20 MEQ tablet Take 1 tablet (20 mEq total) by mouth daily. 11/04/13   Catarina Hartshorn, MD  sertraline (ZOLOFT) 100 MG tablet Take 100 mg by mouth daily.    [provider]  simvastatin (ZOCOR) 40 MG tablet Take 40 mg by mouth every morning.    [provider]  thyroid (ARMOUR) 60 MG tablet Take 60 mg by mouth daily.    [provider]  triamterene-hydrochlorothiazide (DYAZIDE) 37.5-25 MG per capsule Take 1 capsule by mouth daily.    [provider]  venlafaxine (EFFEXOR) 75 MG tablet Take 75 mg by mouth daily.    [provider]    Family History No family history on file.  Social History Social History  Substance Use Topics  . Smoking status: Never Smoker  . Smokeless tobacco: Not on file  . Alcohol use No     Allergies   Lactose intolerance (gi)   Review of Systems Review of Systems  Constitutional: Negative for appetite change and fever.  HENT: Negative for congestion.   Eyes: Negative for visual disturbance.  Respiratory: Negative for chest tightness and shortness of breath.   Cardiovascular: Negative for chest pain and palpitations.  Gastrointestinal: Negative for abdominal pain, blood in stool, nausea and vomiting.  Genitourinary: Negative for decreased urine volume, dysuria and  hematuria.  Musculoskeletal: Negative for back pain.  Skin: Negative for rash.  Neurological: Positive for headaches. Negative for dizziness, seizures, speech difficulty, weakness and light-headedness.  Psychiatric/Behavioral: Negative for behavioral problems and confusion.     Physical Exam Updated Vital Signs BP (!) 146/86   Pulse (!) 48   Temp 98.3 F (36.8 C) (Oral)   Resp 16   SpO2 100%   Physical Exam  Constitutional: He is oriented  to person, place, and time. He appears well-developed and well-nourished. No distress.  HENT:  Head: Atraumatic.  Mouth/Throat: Oropharynx is clear and moist.  Eyes: Conjunctivae and EOM are normal.  Neck: Normal range of motion. Neck supple.  Cardiovascular: Regular rhythm, normal heart sounds and intact distal pulses.   Bradycardia  Pulmonary/Chest: Effort normal and breath sounds normal. No respiratory distress.  Abdominal: Soft. He exhibits no distension. There is no tenderness.  Musculoskeletal: Normal range of motion.  Neurological: He is alert and oriented to person, place, and time. He has normal strength and normal reflexes. No cranial nerve deficit or sensory deficit. GCS eye subscore is 4. GCS verbal subscore is 5. GCS motor subscore is 6.  Skin: Skin is warm. Capillary refill takes less than 2 seconds.  Psychiatric: He has a normal mood and affect.     ED Treatments / Results  Labs (all labs ordered are listed, but only abnormal results are displayed) Labs Reviewed  CBC WITH DIFFERENTIAL/PLATELET - Abnormal; Notable for the following:       Result Value   RBC 4.12 (*)    Hemoglobin 12.3 (*)    HCT 38.1 (*)    All other components within normal limits  BASIC METABOLIC PANEL - Abnormal; Notable for the following:    Sodium 134 (*)    Calcium 8.6 (*)    All other components within normal limits  TSH  MAGNESIUM    EKG  EKG Interpretation  Date/Time:  Saturday April 21 2017 16:02:05 EDT Ventricular Rate:  47 PR Interval:    QRS Duration: 105 QT Interval:  456 QTC Calculation: 404 R Axis:   71 Text Interpretation:  Marked sinus bradycardia Confirmed by Denton Lank  MD, Caryn Bee ([id number]) on 04/21/2017 4:14:37 PM       Radiology No results found.  Procedures Procedures (including critical care time)  Medications Ordered in ED Medications  acetaminophen (TYLENOL) tablet 650 mg (650 mg Oral Given 04/21/17 1558)     Initial Impression / Assessment and Plan / ED  Course  I have reviewed the triage vital signs and the nursing notes.  Pertinent labs & imaging results that were available during my care of the patient were reviewed by me and considered in my medical decision making (see chart for details).  Clinical Course as of Apr 22 108  Sun Apr 22, 2017  0108 TSH: 0.705 [SM]  0108 TSH: 0.705 [SM]    Clinical Course User Index [SM] Corena Herter, MD    Patient is a 61 year old male past medical history significant for autism, thyroid disorder, who presents to the emergency department with a one-day history of dull headache. Concern for elevated blood pressure 180/90 use taken at a group home. Currently asymptomatic with a nonfocal neurologic exam. No headache at this time. Afebrile, BP 125/84, SPO2 100% on room air.  EKG showed Sinus bradycardia, normal intervals, no signs of heart block, no signs of chamber enlargement, no signs of acute ischemia. No prior EKG to compare. On chart review, previously with heart rate in  the 70s. Uncertain of the etiology of the patient's bradycardia. No leukocytosis. No significant electrolyte abnormalities. TSH within normal limits. Planning to obtain a CT head to evaluate for intraparenchymal hemorrhage as etiology of the patient's bradycardia and headache. However patient does not wish to stay for CT head. Patient continues to not have any headaches and a normal neurologic exam.  Patient stable for discharge home. Discussed follow-up with his primary care physician for further blood pressure monitoring. Given strict return precautions to the emergency department. Patient expressed understanding, no questions or concerns at time of discharge.  Final Clinical Impressions(s) / ED Diagnoses   Final diagnoses:  Acute nonintractable headache, unspecified headache type    New Prescriptions Discharge Medication List as of 04/21/2017  3:51 PM       Corena HerterMumma, Kenzel Ruesch, MD 04/22/17 0109    Cathren LaineSteinl, Kevin, MD 04/22/17  1545

## 2017-04-21 NOTE — ED Triage Notes (Signed)
Pt states his blood pressure was 180/100 earlier. Pt BP is normal at this time. No neuro deficits.

## 2017-09-18 ENCOUNTER — Other Ambulatory Visit (HOSPITAL_COMMUNITY): Payer: Self-pay | Admitting: Neurology

## 2017-09-19 ENCOUNTER — Other Ambulatory Visit (HOSPITAL_COMMUNITY): Payer: Self-pay | Admitting: Neurology

## 2017-11-02 ENCOUNTER — Other Ambulatory Visit (HOSPITAL_COMMUNITY): Payer: Self-pay | Admitting: Neurology

## 2017-11-02 DIAGNOSIS — G40311 Generalized idiopathic epilepsy and epileptic syndromes, intractable, with status epilepticus: Principal | ICD-10-CM

## 2017-11-02 DIAGNOSIS — G40419 Other generalized epilepsy and epileptic syndromes, intractable, without status epilepticus: Secondary | ICD-10-CM

## 2017-11-11 ENCOUNTER — Emergency Department
Admission: EM | Admit: 2017-11-11 | Discharge: 2017-11-11 | Disposition: A | Discharge status: Home / Self Care | Payer: Medicare Other | Attending: Emergency Medicine | Admitting: Emergency Medicine

## 2017-11-11 DIAGNOSIS — F840 Autistic disorder: Secondary | ICD-10-CM | POA: Insufficient documentation

## 2017-11-11 DIAGNOSIS — R569 Unspecified convulsions: Secondary | ICD-10-CM | POA: Diagnosis present

## 2017-11-11 DIAGNOSIS — G40909 Epilepsy, unspecified, not intractable, without status epilepticus: Secondary | ICD-10-CM | POA: Diagnosis not present

## 2017-11-11 DIAGNOSIS — Z79899 Other long term (current) drug therapy: Secondary | ICD-10-CM | POA: Insufficient documentation

## 2017-11-11 DIAGNOSIS — I10 Essential (primary) hypertension: Secondary | ICD-10-CM | POA: Diagnosis not present

## 2017-11-11 LAB — CBC WITH DIFFERENTIAL/PLATELET
BASOS PCT: 0 %
Basophils Absolute: 0 10*3/uL (ref 0–0.1)
EOS ABS: 0.1 10*3/uL (ref 0–0.7)
Eosinophils Relative: 1 %
HCT: 39.5 % — ABNORMAL LOW (ref 40.0–52.0)
Hemoglobin: 13 g/dL (ref 13.0–18.0)
Lymphocytes Relative: 7 %
Lymphs Abs: 0.5 10*3/uL — ABNORMAL LOW (ref 1.0–3.6)
MCH: 31 pg (ref 26.0–34.0)
MCHC: 32.9 g/dL (ref 32.0–36.0)
MCV: 94.1 fL (ref 80.0–100.0)
MONO ABS: 0.5 10*3/uL (ref 0.2–1.0)
MONOS PCT: 7 %
Neutro Abs: 5.5 10*3/uL (ref 1.4–6.5)
Neutrophils Relative %: 85 %
Platelets: 314 10*3/uL (ref 150–440)
RBC: 4.2 MIL/uL — ABNORMAL LOW (ref 4.40–5.90)
RDW: 14.9 % — AB (ref 11.5–14.5)
WBC: 6.6 10*3/uL (ref 3.8–10.6)

## 2017-11-11 LAB — BASIC METABOLIC PANEL
Anion gap: 6 (ref 5–15)
BUN: 12 mg/dL (ref 6–20)
CALCIUM: 8.5 mg/dL — AB (ref 8.9–10.3)
CO2: 27 mmol/L (ref 22–32)
CREATININE: 0.84 mg/dL (ref 0.61–1.24)
Chloride: 107 mmol/L (ref 101–111)
GFR calc non Af Amer: >60 mL/min (ref >60)
Glucose, Bld: 98 mg/dL (ref 65–99)
Potassium: 3.3 mmol/L — ABNORMAL LOW (ref 3.5–5.1)
SODIUM: 140 mmol/L (ref 135–145)

## 2017-11-11 LAB — CARBAMAZEPINE LEVEL, TOTAL: CARBAMAZEPINE LVL: 8.2 ug/mL (ref 4.0–12.0)

## 2017-11-11 MED ORDER — SODIUM CHLORIDE 0.9 % IV BOLUS (SEPSIS)
1000.0000 mL | Freq: Once | INTRAVENOUS | Status: AC
  Administered 2017-11-11: 1000 mL via INTRAVENOUS

## 2017-11-11 NOTE — ED Provider Notes (Signed)
[facility] Emergency Department Provider Note  ____________________________________________   First MD Initiated Contact with Patient 11/11/17 1437     (approximate)  I have reviewed the triage vital signs and the nursing notes.   HISTORY  Chief Complaint Seizures   HPI Blake Richards is a 62 y.o. male with a history of autism as well as epilepsy who is presenting to the emergency department today after seizure x2 today.  EMS reports that he did seizure earlier this morning at about 8:30 AM which consisted of 1-2 minutes of generalized tonic-clonic activity.  The patient then had an additional seizure episode just prior to arrival that was similar in character to the first.  However at this time, EMS was called and the patient was transported to the hospital.  The patient reports taking Tegretol and not missing any dosages.  EMS is that the patient was confused after their arrival but has improved in route.  Patient is denying any pain.  Denies any focal weakness or numbness.  Glucose en route was 81.  Patient says that he is only had one banana to eat all day today.  No trauma reported during or as a result of the seizure.  Past Medical History:  Diagnosis Date  . Autism   . Epilepsy (HCC)   . Hypertension     Patient Active Problem List   Diagnosis Date Noted  . Hypokalemia 10/30/2013  . Nausea with vomiting 10/30/2013  . Diarrhea 10/30/2013  . ARF (acute renal failure) (HCC) 10/30/2013  . Acute encephalopathy 10/30/2013  . Leukocytosis, unspecified 10/30/2013  . Metabolic acidosis 10/30/2013    No past surgical history on file.  Prior to Admission medications   Medication Sig Start Date End Date Taking? Authorizing Provider  acetaZOLAMIDE (DIAMOX) 250 MG tablet Take 250 mg by mouth 2 (two) times daily.    [provider]  ALPRAZolam Prudy Feeler(XANAX) 0.25 MG tablet Take 0.25 mg by mouth 2 (two) times daily as needed for anxiety.     [provider]  carbamazepine (TEGRETOL XR) 400 MG 12 hr tablet Take 800 mg by mouth 2 (two) times daily.    [provider]  levETIRAcetam (KEPPRA) 500 MG tablet Take 500-750 mg by mouth every 12 (twelve) hours. 750 morning and 500 mg in the evening    [provider]  potassium chloride SA (K-DUR,KLOR-CON) 20 MEQ tablet Take 1 tablet (20 mEq total) by mouth daily. 11/04/13   Catarina Hartshornat, Urian, MD  sertraline (ZOLOFT) 100 MG tablet Take 100 mg by mouth daily.    [provider]  simvastatin (ZOCOR) 40 MG tablet Take 40 mg by mouth every morning.    [provider]  thyroid (ARMOUR) 60 MG tablet Take 60 mg by mouth daily.    [provider]  triamterene-hydrochlorothiazide (DYAZIDE) 37.5-25 MG per capsule Take 1 capsule by mouth daily.    [provider]  venlafaxine (EFFEXOR) 75 MG tablet Take 75 mg by mouth daily.    [provider]    Allergies Lactose intolerance (gi)  No family history on file.  Social History Social History   Tobacco Use  . Smoking status: Never Smoker  Substance Use Topics  . Alcohol use: No  . Drug use: No    Review of Systems  Constitutional: No fever/chills Eyes: No visual changes. ENT: No sore throat. Cardiovascular: Denies chest pain. Respiratory: Denies shortness of breath. Gastrointestinal: No abdominal pain.  No nausea, no vomiting.  No diarrhea.  No constipation. Genitourinary: Negative for dysuria. Musculoskeletal: Negative for back pain. Skin: Negative for rash. Neurological: Negative for headaches, focal weakness or numbness.   ____________________________________________   PHYSICAL EXAM:  VITAL SIGNS: ED Triage Vitals  Enc Vitals Group     BP 11/11/17 1438 129/84     Pulse Rate 11/11/17 1438 84     Resp 11/11/17 1438 19     Temp 11/11/17 1438 97.6 F (36.4 C)     Temp Source 11/11/17 1438 Oral     SpO2 11/11/17 1438 99 %     Weight 11/11/17 1437 215 lb (97.5 kg)      Height 11/11/17 1437 6' (1.829 m)     Head Circumference --      Peak Flow --      Pain Score --      Pain Loc --      Pain Edu? --      Excl. in GC? --     Constitutional: Alert and oriented but is slow to respond.  Patient is oriented x3. Well appearing and in no acute distress. Eyes: Conjunctivae are normal.  Head: Atraumatic. Nose: No congestion/rhinnorhea. Mouth/Throat: Mucous membranes are moist.  Neck: No stridor.   Cardiovascular: Normal rate, regular rhythm. Grossly normal heart sounds.   Respiratory: Normal respiratory effort.  No retractions. Lungs CTAB. Gastrointestinal: Soft and nontender. No distention.  Musculoskeletal: No lower extremity tenderness nor edema.  No joint effusions. Neurologic:  Normal speech and language. No gross focal neurologic deficits are appreciated. Skin:  Skin is warm, dry and intact. No rash noted. Psychiatric: Mood and affect are normal. Speech and behavior are normal.  ____________________________________________   LABS (all labs ordered are listed, but only abnormal results are displayed)  Labs Reviewed  CBC WITH DIFFERENTIAL/PLATELET - Abnormal; Notable for the following components:      Result Value   RBC 4.20 (*)    HCT 39.5 (*)    RDW 14.9 (*)    Lymphs Abs 0.5 (*)    All other components within normal limits  BASIC METABOLIC PANEL - Abnormal; Notable for the following components:   Potassium 3.3 (*)    Calcium 8.5 (*)    All other components within normal limits  CARBAMAZEPINE LEVEL, TOTAL  URINALYSIS, COMPLETE (UACMP) WITH MICROSCOPIC   ____________________________________________  EKG  ED ECG REPORT I, Arelia Longest, the attending physician, personally viewed and interpreted this ECG.   Date: 11/11/2017  EKG Time: 1436  Rate: 84  Rhythm: Normal sinus rhythm  Axis: Normal   Intervals:Prolonged PR  ST&T Change: No ST segment elevation or depression.  No abnormal T wave  inversion.  ____________________________________________  RADIOLOGY   ____________________________________________   PROCEDURES  Procedure(s) performed:   Procedures  Critical Care performed:   ____________________________________________   INITIAL IMPRESSION / ASSESSMENT AND PLAN / ED COURSE  Pertinent labs & imaging results that were available during my care of the patient were reviewed by me and considered in my medical decision making (see chart for details).  DDX: Seizure, postictal state, UTI, subtherapeutic on Tegretol, breakthrough seizure, electrolyte abnormality, renal failure As part of my medical decision making, I reviewed the following data within the electronic MEDICAL RECORD NUMBER Notes from prior ED visits as well as family medicine visit.  ----------------------------------------- 3:58 PM on 11/11/2017 ----------------------------------------- Patient's legal guardian and family now at the bedside.  They corroborate the EMS report that the episodes today were 1-2 minutes and generalized tonic-clonic activity.  The guardian states  that the patient has not missed any doses of his medications.  Says that the patient recently started Resporal 1-2 months ago.  Also corroborates that the last seizure was in late August.  Usually, per family and guardian, the patient has seizures when he has overheated or stress.  However, they do not think he has been under either of these conditions recently.    ----------------------------------------- 6:09 PM on 11/11/2017 -----------------------------------------  I discussed the case with the patient's neurologist, Dr. Arville Go, who does not recommend any medication changes at this time but recommends the patient follow-up in the office tomorrow.  She recommends that the patient call at [phone] for that the power of attorney: [phone] to schedule a same-day appointment.  I explained this plan to the patient as well as the caretaker.   They are understanding and willing to comply.  The patient maintains his baseline mental status.  He has not any seizure activity here in the emergency department.  ____________________________________________   FINAL CLINICAL IMPRESSION(S) / ED DIAGNOSES  Seizure    NEW MEDICATIONS STARTED DURING THIS VISIT:  This SmartLink is deprecated. Use AVSMEDLIST instead to display the medication list for a patient.   Note:  This document was prepared using Dragon voice recognition software and may include unintentional dictation errors.     Myrna Blazer, MD 11/11/17 [phone]

## 2017-11-11 NOTE — ED Notes (Signed)
Attempted to contact family/caregiver. No answer from numbers provided in chart.

## 2017-11-11 NOTE — ED Notes (Signed)
Spoke with group home owner Eber JonesCarolyn about plan to d/c patient back to her facility. Legal guardian at bedside who will be providing transportation back to group home. Legal guardian signed for discharge.

## 2017-11-11 NOTE — ED Notes (Signed)
MD verifies pt can have food/drink. Pt given sandwich tray and drink

## 2017-11-11 NOTE — ED Triage Notes (Signed)
Pt arrived via ems from family's house after family reported a seizure. Family reported 2 seizures today, one this am and one just prior to arrival. EMS reports seizure only lasted "a minute or two." EMS reports a recent change in medication but a compliance in medication. Pt's legal guardian nor family are at bedside.

## 2017-11-12 ENCOUNTER — Ambulatory Visit (HOSPITAL_COMMUNITY): Payer: Medicare Other

## 2017-11-13 ENCOUNTER — Telehealth: Payer: Self-pay | Admitting: Neurology

## 2017-11-13 ENCOUNTER — Other Ambulatory Visit (HOSPITAL_COMMUNITY): Payer: Self-pay | Admitting: Neurology

## 2017-11-13 DIAGNOSIS — G40311 Generalized idiopathic epilepsy and epileptic syndromes, intractable, with status epilepticus: Principal | ICD-10-CM

## 2017-11-13 DIAGNOSIS — G40419 Other generalized epilepsy and epileptic syndromes, intractable, without status epilepticus: Secondary | ICD-10-CM

## 2017-11-14 ENCOUNTER — Ambulatory Visit (HOSPITAL_COMMUNITY): Payer: Medicare Other

## 2017-11-14 ENCOUNTER — Encounter (HOSPITAL_COMMUNITY): Payer: Self-pay

## 2017-11-14 ENCOUNTER — Ambulatory Visit (HOSPITAL_COMMUNITY)
Admission: RE | Admit: 2017-11-14 | Discharge: 2017-11-14 | Disposition: A | Discharge status: Home / Self Care | Payer: Medicare Other | Source: Ambulatory Visit | Attending: Neurology | Admitting: Neurology

## 2017-11-14 DIAGNOSIS — G9389 Other specified disorders of brain: Secondary | ICD-10-CM | POA: Insufficient documentation

## 2017-11-14 DIAGNOSIS — G40311 Generalized idiopathic epilepsy and epileptic syndromes, intractable, with status epilepticus: Secondary | ICD-10-CM | POA: Diagnosis present

## 2017-11-14 DIAGNOSIS — G40419 Other generalized epilepsy and epileptic syndromes, intractable, without status epilepticus: Secondary | ICD-10-CM

## 2018-03-05 ENCOUNTER — Encounter (HOSPITAL_COMMUNITY): Payer: Self-pay | Admitting: Psychiatry

## 2018-03-05 ENCOUNTER — Ambulatory Visit (INDEPENDENT_AMBULATORY_CARE_PROVIDER_SITE_OTHER): Payer: Medicare Other | Admitting: Psychiatry

## 2018-03-05 VITALS — Ht 72.0 in

## 2018-03-05 DIAGNOSIS — F840 Autistic disorder: Secondary | ICD-10-CM | POA: Diagnosis not present

## 2018-03-05 DIAGNOSIS — Z560 Unemployment, unspecified: Secondary | ICD-10-CM

## 2018-03-05 DIAGNOSIS — F411 Generalized anxiety disorder: Secondary | ICD-10-CM

## 2018-03-05 MED ORDER — RISPERIDONE 0.5 MG PO TABS: 0.5000 mg | ORAL_TABLET | Freq: Every day | ORAL | 0 refills | Status: DC

## 2018-03-05 MED ORDER — SERTRALINE HCL 100 MG PO TABS: 100.0000 mg | ORAL_TABLET | Freq: Every day | ORAL | 0 refills | Status: DC

## 2018-03-05 NOTE — Progress Notes (Signed)
Psychiatric Initial Adult Assessment   Patient Identification: Dacoda Finlay MRN:  [id number] Date of Evaluation:  03-07-2018 Referral Source: Primary care physician. Chief Complaint:  I do not know why I am here.  Visit Diagnosis:    ICD-10-CM   1. GAD (generalized anxiety disorder) F41.1 sertraline (ZOLOFT) 100 MG tablet    risperiDONE (RISPERDAL) 0.5 MG tablet    DISCONTINUED: sertraline (ZOLOFT) 100 MG tablet    DISCONTINUED: risperiDONE (RISPERDAL) 0.5 MG tablet  2. Autism F84.0     History of Present Illness: Najeeb is a 62 year old Caucasian, single, unemployed man who has diagnosis of autism and generalized anxiety disorder.  Patient came with the staff of group home and his legal guardian.  Patient is a poor historian and most of the information is obtained through his legal guardian and group home staff member.  As per them patient is taking Risperdal for past 1 year when he is having a lot of anxiety, preservation, restlessness and talking to himself.  Patient has been living in the group home for the past 1 years.  Before he was living with his mother who died in Mar 07, 2013.  Patient never seen psychiatrist until 1 year ago.  There is no history of violence, aggression, suicidality, agitation, mania, psychosis or any hallucination.  He has been taking antidepressant prescribed by primary care physician for the past few years.  His legal guardian does not know why antidepressant was given.  However there has been a history of anxiety and nervousness.  He was prescribed Risperdal here ago by [employer] by Oneta Rack and since then staff reported he is more calm, cooperative and able to follow directions.  He is not talking to himself.  Staff and legal guardian wants him to follow-up here because they have difficulty [facility] because they only see the patient on Monday.  As per legal guardian patient is not involved in any aggression and he sleeps good.  He  follows routine of group home and there has been no recent incident.  Patient has no tremors, shakes or any EPS.  He takes Risperdal 0.5 mg at bedtime and Zoloft 100 mg daily.  Patient has no tremors, shakes or any EPS.  He denies any paranoia, nightmares, flashback, panic attacks.  His appetite is okay.  His vital signs are stable.  He enjoys listening music and solving puzzles.  He has limited interaction with other people.  His parents are deceased.  He has no other siblings.  He did home schooling by his mother.  Patient denies drinking or using any illegal substances.  Staff and legal guardian wants to continue Risperdal and Zoloft.  He also takes Xanax 0.25 mg prescribed by primary care physician for anxiety.  He is able to do his ADLs.  Patient has multiple health problems including hypertension and epilepsy.  His last seizure was few months ago.  He is taking a moderate dose of antiepileptic medication.  He see Dr. Assunta Curtis for his seizures.    Past Psychiatric History: As per legal guardian patient has no history of psychiatric inpatient treatment.  He has anxiety and his primary care physician prescribed Effexor in the past.  It is unclear why Effexor was discontinued.  He was given Risperdal because he was noticed restless, talking to himself and repeating to himself.  There is no history of violence or aggression.  Previous Psychotropic Medications: Yes   Substance Abuse History in the last 12 months:  No.  Consequences of Substance Abuse: Negative  Past Medical History:  Past Medical History:  Diagnosis Date  . Autism   . Epilepsy (HCC)   . Hypertension    No past surgical history on file.  Family Psychiatric History: Unknown.  Family History: No family history on file.  Social History:   Social History   Socioeconomic History  . Marital status: Single    Spouse name: Not on file  . Number of children: Not on file  . Years of education: Not on file  . Highest education  level: Not on file  Occupational History  . Not on file  Social Needs  . Financial resource strain: Not on file  . Food insecurity:    Worry: Not on file    Inability: Not on file  . Transportation needs:    Medical: Not on file    Non-medical: Not on file  Tobacco Use  . Smoking status: Never Smoker  . Smokeless tobacco: Never Used  Substance and Sexual Activity  . Alcohol use: No  . Drug use: No  . Sexual activity: Not on file  Lifestyle  . Physical activity:    Days per week: Not on file    Minutes per session: Not on file  . Stress: Not on file  Relationships  . Social connections:    Talks on phone: Not on file    Gets together: Not on file    Attends religious service: Not on file    Active member of club or organization: Not on file    Attends meetings of clubs or organizations: Not on file    Relationship status: Not on file  Other Topics Concern  . Not on file  Social History Narrative  . Not on file    Additional Social History: Patient born and raised in [place].  He did home schooling.  His parents are deceased.  He has no siblings.  He lives in a group home.  He has a legal guardian who was involved in his treatment plan.  Allergies:   Allergies  Allergen Reactions  . Lactose Intolerance (Gi) Other (See Comments)    Upset stomach    Metabolic Disorder Labs: No results found for: HGBA1C, MPG No results found for: PROLACTIN No results found for: CHOL, TRIG, HDL, CHOLHDL, VLDL, LDLCALC   Current Medications: Current Outpatient Medications  Medication Sig Dispense Refill  . acetaZOLAMIDE (DIAMOX) 250 MG tablet Take 250 mg by mouth 2 (two) times daily.    Marland Kitchen ALPRAZolam (XANAX) 0.25 MG tablet Take 0.25 mg by mouth 2 (two) times daily as needed for anxiety.     Marland Kitchen atorvastatin (LIPITOR) 10 MG tablet Take 10 mg by mouth daily.    . carbamazepine (TEGRETOL XR) 400 MG 12 hr tablet Take 800 mg by mouth 2 (two) times daily.    Marland Kitchen levETIRAcetam (KEPPRA) 500  MG tablet Take 500-750 mg by mouth every 12 (twelve) hours. 750 morning and 500 mg in the evening    . potassium chloride SA (K-DUR,KLOR-CON) 20 MEQ tablet Take 1 tablet (20 mEq total) by mouth daily. 3 tablet 0  . sertraline (ZOLOFT) 100 MG tablet Take 100 mg by mouth daily.    . simvastatin (ZOCOR) 40 MG tablet Take 40 mg by mouth every morning.    . thyroid (ARMOUR) 60 MG tablet Take 60 mg by mouth daily.    Marland Kitchen triamterene-hydrochlorothiazide (DYAZIDE) 37.5-25 MG per capsule Take 1 capsule by mouth daily.    Marland Kitchen  venlafaxine (EFFEXOR) 75 MG tablet Take 75 mg by mouth daily.     No current facility-administered medications for this visit.     Neurologic: Headache: No Seizure: Yes Paresthesias:No  Musculoskeletal: Strength & Muscle Tone: within normal limits Gait & Station: normal Patient leans: N/A  Psychiatric Specialty Exam: Review of Systems  Constitutional: Negative.   Respiratory: Negative.   Cardiovascular: Negative.   Psychiatric/Behavioral: Negative.     Height 6' (1.829 m).Body mass index is 29.16 kg/m.  General Appearance: Well Groomed and Superficially cooperative  Eye Contact:  Fair  Speech:  Slow  Volume:  Decreased  Mood:  Anxious  Affect:  Restricted  Thought Process:  Descriptions of Associations: Loose  Orientation:  Full (Time, Place, and Person)  Thought Content:  Poverty of thought content  Suicidal Thoughts:  No  Homicidal Thoughts:  No  Memory:  Immediate;   Fair Recent;   Fair Remote;   Poor  Judgement:  Fair  Insight:  Fair  Psychomotor Activity:  Decreased  Concentration:  Concentration: Fair and Attention Span: Fair  Recall:  Poor  Fund of Knowledge:Fair  Language: Fair  Akathisia:  No  Handed:  Right  AIMS (if indicated):  0  Assets:  Housing Social Support  ADL's:  Intact  Cognition: Impaired,  Mild  Sleep: Good.   Assessment: Generalized anxiety disorder.  Autism.  Plan: I review his symptoms, history, collateral information  from other providers, current medication and blood work results.  Patient is 62 year old with autism and generalized anxiety disorder.  He has been prescribed Risperdal 1 year ago because he was feeling very restless, talking to himself.  Etiology unknown.  His legal guardian and his staff like to continue Risperdal since it is helping him.  We discussed that Risperdal is antipsychotic medication and it has side effects including metabolic syndrome, EPS and shakes.  We will [facility].  I discussed that in the future we may consider switching from Risperdal to a different medication if patient is started to have side effects.  I will continue Zoloft 200 mg daily and Risperdal 0.5 mg at bedtime for now.  He is getting Xanax from his primary care physician.  Discussed medication side effects and benefits.  Recommended to call us back if he has any question, concern if he feels worsening of the symptoms.  Follow-up in 3 months.  Cleotis Nipper, MD 4/30/201911:11 AM

## 2018-03-06 ENCOUNTER — Other Ambulatory Visit (HOSPITAL_COMMUNITY): Payer: Self-pay | Admitting: Psychiatry

## 2018-03-06 ENCOUNTER — Telehealth (HOSPITAL_COMMUNITY): Payer: Self-pay

## 2018-03-06 NOTE — Telephone Encounter (Signed)
Please call Xanax 0.25 mg twice a day as needed for 30 days.  We will discuss to try a different medication on his next appointment.

## 2018-03-06 NOTE — Telephone Encounter (Signed)
Patients group home is calling, they said they need a refill on the Xanax. His PCP was prescribing psych meds until he had an appointment here. They said this is one of the medications they wanted you to take over. Please review and advise, thank you

## 2018-03-08 MED ORDER — ALPRAZOLAM 0.25 MG PO TABS: 0.2500 mg | ORAL_TABLET | Freq: Two times a day (BID) | ORAL | 0 refills | Status: DC | PRN

## 2018-03-08 NOTE — Telephone Encounter (Signed)
Called a one month supply to [facility]

## 2018-05-07 ENCOUNTER — Other Ambulatory Visit (HOSPITAL_COMMUNITY): Payer: Self-pay | Admitting: Psychiatry

## 2018-05-08 ENCOUNTER — Other Ambulatory Visit (HOSPITAL_COMMUNITY): Payer: Self-pay

## 2018-05-08 MED ORDER — ALPRAZOLAM 0.25 MG PO TABS: 0.2500 mg | ORAL_TABLET | Freq: Two times a day (BID) | ORAL | 0 refills | Status: DC | PRN

## 2018-06-05 ENCOUNTER — Encounter (HOSPITAL_COMMUNITY): Payer: Self-pay | Admitting: Psychiatry

## 2018-06-05 ENCOUNTER — Ambulatory Visit (INDEPENDENT_AMBULATORY_CARE_PROVIDER_SITE_OTHER): Payer: Medicare Other | Admitting: Psychiatry

## 2018-06-05 VITALS — BP 126/74 | HR 64 | Ht 72.0 in | Wt 171.6 lb

## 2018-06-05 DIAGNOSIS — F411 Generalized anxiety disorder: Secondary | ICD-10-CM

## 2018-06-05 DIAGNOSIS — Z79899 Other long term (current) drug therapy: Secondary | ICD-10-CM

## 2018-06-05 MED ORDER — SERTRALINE HCL 100 MG PO TABS: 100.0000 mg | ORAL_TABLET | Freq: Every day | ORAL | 0 refills | Status: DC

## 2018-06-05 MED ORDER — RISPERIDONE 0.5 MG PO TABS: 0.5000 mg | ORAL_TABLET | Freq: Every day | ORAL | 0 refills | Status: DC

## 2018-06-05 MED ORDER — ALPRAZOLAM 0.25 MG PO TABS: 0.2500 mg | ORAL_TABLET | Freq: Two times a day (BID) | ORAL | 0 refills | Status: DC | PRN

## 2018-06-05 NOTE — Progress Notes (Addendum)
BH MD/PA/NP OP Progress Note  06/05/2018 2:47 PM Blake Richards  MRN:  [id number]  Chief Complaint: patient returns for medication management visit  HPI: I am covering for Dr. Lolly Mustache who is out of office. Patient/guardian aware that he will continue to follow up with Dr. Lolly Mustache. 62 year old male, Group Home Resident [facility]). Presents with his Group Home staff member and with his legal guardian. He has a history of autism spectrum disorder and anxiety. Has been diagnosed with GAD. Legal guardian and group home staff member provide most history- report is that patient is currently stable, doing well, recently travelled to [place] with staff and did well there. He has had no episodes of agitation, is cooperative, and they do not think there has been any worsening of anxiety or of mood. No medication side effects have been reported or noted, and patient denies having side effects. Patient states he feels " all right", denies feeling depressed or anxious today. Denies SI, denies /does not endorse neuro-vegetative symptoms of depression at present. Visit Diagnosis:    ICD-10-CM   1. Encounter for long-term (current) use of medications Z79.899 Lipid Panel With LDL/HDL Ratio    Hemoglobin A1c    Hemoglobin A1c    Lipid Panel With LDL/HDL Ratio  2. GAD (generalized anxiety disorder) F41.1 sertraline (ZOLOFT) 100 MG tablet    risperiDONE (RISPERDAL) 0.5 MG tablet    Lipid Panel With LDL/HDL Ratio    Hemoglobin A1c    Hemoglobin A1c    Lipid Panel With LDL/HDL Ratio    Past Psychiatric History:   Past Medical History:  Past Medical History:  Diagnosis Date  . Autism   . Epilepsy (HCC)   . Hypertension    No past surgical history on file.  Family Psychiatric History:   Family History: No family history on file.  Social History:  Social History   Socioeconomic History  . Marital status: Single    Spouse name: Not on file  . Number of children: Not on file  . Years of  education: Not on file  . Highest education level: Not on file  Occupational History  . Not on file  Social Needs  . Financial resource strain: Not on file  . Food insecurity:    Worry: Not on file    Inability: Not on file  . Transportation needs:    Medical: Not on file    Non-medical: Not on file  Tobacco Use  . Smoking status: Never Smoker  . Smokeless tobacco: Never Used  Substance and Sexual Activity  . Alcohol use: No  . Drug use: No  . Sexual activity: Not on file  Lifestyle  . Physical activity:    Days per week: Not on file    Minutes per session: Not on file  . Stress: Not on file  Relationships  . Social connections:    Talks on phone: Not on file    Gets together: Not on file    Attends religious service: Not on file    Active member of club or organization: Not on file    Attends meetings of clubs or organizations: Not on file    Relationship status: Not on file  Other Topics Concern  . Not on file  Social History Narrative  . Not on file    Allergies:  Allergies  Allergen Reactions  . Lactose Intolerance (Gi) Other (See Comments)    Upset stomach    Metabolic Disorder Labs: No results  found for: HGBA1C, MPG No results found for: PROLACTIN No results found for: CHOL, TRIG, HDL, CHOLHDL, VLDL, LDLCALC Lab Results  Component Value Date   TSH 0.705 04/21/2017    Therapeutic Level Labs: No results found for: LITHIUM No results found for: VALPROATE No components found for:  CBMZ  Current Medications: Current Outpatient Medications  Medication Sig Dispense Refill  . acetaZOLAMIDE (DIAMOX) 250 MG tablet Take 250 mg by mouth 2 (two) times daily.    Marland Kitchen. ALPRAZolam (XANAX) 0.25 MG tablet Take 1 tablet (0.25 mg total) by mouth 2 (two) times daily as needed for anxiety. 60 tablet 0  . atorvastatin (LIPITOR) 10 MG tablet Take 10 mg by mouth daily.    . carbamazepine (TEGRETOL XR) 400 MG 12 hr tablet Take 800 mg by mouth 2 (two) times daily.    Marland Kitchen.  levETIRAcetam (KEPPRA) 500 MG tablet Take 500-750 mg by mouth every 12 (twelve) hours. 750 morning and 500 mg in the evening    . risperiDONE (RISPERDAL) 0.5 MG tablet Take 1 tablet (0.5 mg total) by mouth at bedtime. 90 tablet 0  . sertraline (ZOLOFT) 100 MG tablet Take 1 tablet (100 mg total) by mouth daily. 90 tablet 0  . simvastatin (ZOCOR) 40 MG tablet Take 40 mg by mouth every morning.    . thyroid (ARMOUR) 60 MG tablet Take 60 mg by mouth daily.    Marland Kitchen. triamterene-hydrochlorothiazide (DYAZIDE) 37.5-25 MG per capsule Take 1 capsule by mouth daily.     No current facility-administered medications for this visit.      Musculoskeletal: Strength & Muscle Tone: within normal limits Gait & Station: normal Patient leans: N/A  Psychiatric Specialty Exam: ROS no headache, no chest pain, no nausea or vomiting endorsed   Blood pressure 126/74, pulse 64, height 6' (1.829 m), weight 77.8 kg (171 lb 9.6 oz), SpO2 97 %.Body mass index is 23.27 kg/m.  General Appearance: Well Groomed  Eye Contact:  Fair  Speech:  Normal Rate  Volume:  monotone   Mood:  denies depression, mood "OK"  Affect:  appropriate, does not appear overtly anxious at this time  Thought Process:  Linear and Descriptions of Associations: Intact  Orientation:  Other:  alert, attentive  Thought Content: no hallucinations, no delusions, not internally preoccupied    Suicidal Thoughts:  No  Homicidal Thoughts:  No  Memory:  recent and remote fair   Judgement:  Fair  Insight:  Fair  Psychomotor Activity:  Normal- no psychomotor agitation or restlessness   Concentration:  Concentration: Fair and Attention Span: Fair  Recall:  Good  Fund of Knowledge: Good  Language: Good  Akathisia:  Negative  Handed:  Right  AIMS (if indicated): AIMS test not done today  Assets:  Desire for Improvement Resilience  ADL's:  Intact  Cognition: WNL  Sleep:  Good   Screenings:   Assessment and Plan: [age] year old male, Group Home  resident, [facility] and Legal guardian who accompany him to visit. History of Autism Spectrum Disorder and Anxiety disorder. Currently he is stable , at baseline, doing well on current regimen. Renew Xanax 0.25 mgrs BID for anxiety , Risperidone 0.5 mgrs QHS , Zoloft 100 mgrs QDAY . Will see in 3 months. As requested by Group Home staff member signed a form for the Group Home records describing current status and medication management-  Medical assistant has made copy to scan into chart . Check Hgb A1C, Lipid Panel- routine as on chronic  antipsychotic management   Craige Cotta, MD 06/05/2018, 2:47 PM

## 2018-06-06 LAB — LIPID PANEL WITH LDL/HDL RATIO
Cholesterol, Total: 165 mg/dL (ref 100–199)
HDL: 50 mg/dL (ref >39)
LDL CALC: 96 mg/dL (ref 0–99)
LDL/HDL RATIO: 1.9 ratio (ref 0.0–3.6)
Triglycerides: 95 mg/dL (ref 0–149)
VLDL CHOLESTEROL CAL: 19 mg/dL (ref 5–40)

## 2018-06-06 LAB — HEMOGLOBIN A1C
Est. average glucose Bld gHb Est-mCnc: 103 mg/dL
Hgb A1c MFr Bld: 5.2 % (ref 4.8–5.6)

## 2018-07-04 ENCOUNTER — Other Ambulatory Visit (HOSPITAL_COMMUNITY): Payer: Self-pay

## 2018-07-04 MED ORDER — ALPRAZOLAM 0.25 MG PO TABS: 0.2500 mg | ORAL_TABLET | Freq: Two times a day (BID) | ORAL | 0 refills | Status: DC | PRN

## 2018-08-08 ENCOUNTER — Telehealth (HOSPITAL_COMMUNITY): Payer: Self-pay

## 2018-08-08 NOTE — Telephone Encounter (Signed)
Blake Richards had called wanting to know why the patients script for Xanax had changed. Called and left voicemail message for a return phone call

## 2018-08-08 NOTE — Telephone Encounter (Signed)
Blake Richards with patients group home called back. I explained to her that we had been prescribing the Xanax bid prn since May - she states that she is only giving it to him in the morning and she does not feel that he needs it at night. I told her that was fine, that the medication was as needed, and if he did not need it, then it is okay to not give it. She will discuss with Dr. Lolly Mustache at their follow up later this month.

## 2018-08-21 ENCOUNTER — Other Ambulatory Visit (HOSPITAL_COMMUNITY): Payer: Self-pay | Admitting: Psychiatry

## 2018-09-04 ENCOUNTER — Ambulatory Visit (HOSPITAL_COMMUNITY): Payer: Self-pay | Admitting: Psychiatry

## 2018-09-17 ENCOUNTER — Ambulatory Visit (INDEPENDENT_AMBULATORY_CARE_PROVIDER_SITE_OTHER): Payer: Medicare Other | Admitting: Psychiatry

## 2018-09-17 VITALS — BP 128/76 | Ht 72.0 in | Wt 173.0 lb

## 2018-09-17 DIAGNOSIS — F411 Generalized anxiety disorder: Secondary | ICD-10-CM

## 2018-09-17 DIAGNOSIS — F840 Autistic disorder: Secondary | ICD-10-CM | POA: Diagnosis not present

## 2018-09-17 MED ORDER — RISPERIDONE 0.5 MG PO TABS: 0.5000 mg | ORAL_TABLET | Freq: Every day | ORAL | 0 refills | Status: DC

## 2018-09-17 MED ORDER — SERTRALINE HCL 100 MG PO TABS: 100.0000 mg | ORAL_TABLET | Freq: Every day | ORAL | 0 refills | Status: DC

## 2018-09-17 MED ORDER — ALPRAZOLAM 0.25 MG PO TABS: 0.2500 mg | ORAL_TABLET | ORAL | 0 refills | Status: DC

## 2018-09-17 NOTE — Progress Notes (Signed)
BH MD/PA/NP OP Progress Note  09/17/2018 3:51 PM Blake Richards  MRN:  [id number]  Chief Complaint: I am doing fine.  I am sleeping good.  HPI: Ballon came for his follow-up appointment with legal guardian.  He is doing very well on his current medication.  He denies any agitation or any hallucination.  He does not seems to be under distress and does not talk to himself.  Legal guardian mentioned that his current medicine is working very well.  He admitted some time anxious when there is a thunderstorm but he has no major panic attack.  He lives in a group home at [facility] with legal guardian.  He had autism spectrum disorder.  Patient currently stable and tolerating his medication without any side effects.  He denies any crying spells, irritability, mania or any feeling of hopelessness or worthlessness.  He wants to continue his current psychiatric medication.  Recently he had blood work which was ordered by covering psychiatrist.  His lipid panel is normal.  Visit Diagnosis:    ICD-10-CM   1. Autism F84.0 risperiDONE (RISPERDAL) 0.5 MG tablet  2. GAD (generalized anxiety disorder) F41.1 ALPRAZolam (XANAX) 0.25 MG tablet    risperiDONE (RISPERDAL) 0.5 MG tablet    sertraline (ZOLOFT) 100 MG tablet    Past Psychiatric History: Reviewed. As per legal guardian patient has no history of psychiatric inpatient treatment.  He has a history of anxiety and given prescription Effexor from the primary care physician.  It is unclear why Effexor was discontinued.  [name] and given Risperdal because of hallucination.  There is no history of violence or aggression.  Past Medical History:  Past Medical History:  Diagnosis Date  . Autism   . Epilepsy (HCC)   . Hypertension    No past surgical history on file.  Family Psychiatric History: Reviewed.  Family History: No family history on file.  Social History:  Social History   Socioeconomic History  . Marital  status: Single    Spouse name: Not on file  . Number of children: Not on file  . Years of education: Not on file  . Highest education level: Not on file  Occupational History  . Not on file  Social Needs  . Financial resource strain: Not on file  . Food insecurity:    Worry: Not on file    Inability: Not on file  . Transportation needs:    Medical: Not on file    Non-medical: Not on file  Tobacco Use  . Smoking status: Never Smoker  . Smokeless tobacco: Never Used  Substance and Sexual Activity  . Alcohol use: No  . Drug use: No  . Sexual activity: Not on file  Lifestyle  . Physical activity:    Days per week: Not on file    Minutes per session: Not on file  . Stress: Not on file  Relationships  . Social connections:    Talks on phone: Not on file    Gets together: Not on file    Attends religious service: Not on file    Active member of club or organization: Not on file    Attends meetings of clubs or organizations: Not on file    Relationship status: Not on file  Other Topics Concern  . Not on file  Social History Narrative  . Not on file    Allergies:  Allergies  Allergen Reactions  . Lactose Intolerance (Gi) Other (See Comments)  Upset stomach    Metabolic Disorder Labs: No results found for this or any previous visit (from the past 2160 hour(s)). Lab Results  Component Value Date   HGBA1C 5.2 06/05/2018   No results found for: PROLACTIN Lab Results  Component Value Date   CHOL 165 06/05/2018   TRIG 95 06/05/2018   HDL 50 06/05/2018   LDLCALC 96 06/05/2018   Lab Results  Component Value Date   TSH 0.705 04/21/2017    Therapeutic Level Labs: No results found for: LITHIUM No results found for: VALPROATE No components found for:  CBMZ  Current Medications: Current Outpatient Medications  Medication Sig Dispense Refill  . acetaZOLAMIDE (DIAMOX) 250 MG tablet Take 250 mg by mouth 2 (two) times daily.    Marland Kitchen. ALPRAZolam (XANAX) 0.25 MG tablet  Take 1 tablet (0.25 mg total) by mouth 2 (two) times daily as needed for anxiety. 180 tablet 0  . atorvastatin (LIPITOR) 10 MG tablet Take 10 mg by mouth daily.    . carbamazepine (TEGRETOL XR) 400 MG 12 hr tablet Take 800 mg by mouth 2 (two) times daily.    Marland Kitchen. levETIRAcetam (KEPPRA) 500 MG tablet Take 500-750 mg by mouth every 12 (twelve) hours. 750 morning and 500 mg in the evening    . risperiDONE (RISPERDAL) 0.5 MG tablet Take 1 tablet (0.5 mg total) by mouth at bedtime. 90 tablet 0  . sertraline (ZOLOFT) 100 MG tablet Take 1 tablet (100 mg total) by mouth daily. 90 tablet 0  . simvastatin (ZOCOR) 40 MG tablet Take 40 mg by mouth every morning.    . thyroid (ARMOUR) 60 MG tablet Take 60 mg by mouth daily.    Marland Kitchen. triamterene-hydrochlorothiazide (DYAZIDE) 37.5-25 MG per capsule Take 1 capsule by mouth daily.     No current facility-administered medications for this visit.      Musculoskeletal: Strength & Muscle Tone: within normal limits Gait & Station: normal Patient leans: N/A  Psychiatric Specialty Exam: ROS  Blood pressure 128/76, height 6' (1.829 m), weight 173 lb (78.5 kg).There is no height or weight on file to calculate BMI.  General Appearance: Well Groomed  Eye Contact:  Fair  Speech:  Normal Rate  Volume:  monotonus  Mood:  ok  Affect:  Flat  Thought Process:  Descriptions of Associations: Intact  Orientation:  Full (Time, Place, and Person)  Thought Content: no hallucination or delusion   Suicidal Thoughts:  No  Homicidal Thoughts:  No  Memory:  Immediate;   Fair Recent;   Fair Remote;   Fair  Judgement:  Fair  Insight:  Fair  Psychomotor Activity:  Normal  Concentration:  Concentration: Fair and Attention Span: Fair  Recall:  [employer]Fair  Fund of Knowledge: Fair  Language: Fair  Akathisia:  No  Handed:  Right  AIMS (if indicated): not done  Assets:  Desire for Improvement Housing  ADL's:  Intact  Cognition: WNL  Sleep:  Good   Screenings:   Assessment and  Plan: Autism spectrum disorder.  Generalized anxiety disorder.  Patient is a stable on his current medication.  His legal guardian reluctant to cut down the medication since patient had history of talking to himself and the current medicine working very well.  I will continue Risperdal 0.5 mg at bedtime, Zoloft 100 mg daily and Xanax 0.25 mg in the morning.  Discussed medication side effects and benefits.  I also reviewed his blood work results his hemoglobin A1c and lipid panel is normal.  We will  continue to monitor closely and try to lower his Risperdal in the future.  Recommended to call us back if is any question or any concern.  Follow-up in 3 months.   Cleotis Nipper, MD 09/17/2018, 3:51 PM

## 2018-12-11 ENCOUNTER — Encounter (HOSPITAL_COMMUNITY): Payer: Self-pay | Admitting: Psychiatry

## 2018-12-11 ENCOUNTER — Ambulatory Visit (INDEPENDENT_AMBULATORY_CARE_PROVIDER_SITE_OTHER): Payer: Medicare Other | Admitting: Psychiatry

## 2018-12-11 VITALS — BP 126/74 | Ht 72.0 in | Wt 176.8 lb

## 2018-12-11 DIAGNOSIS — F840 Autistic disorder: Secondary | ICD-10-CM | POA: Diagnosis not present

## 2018-12-11 DIAGNOSIS — F29 Unspecified psychosis not due to a substance or known physiological condition: Secondary | ICD-10-CM

## 2018-12-11 DIAGNOSIS — F411 Generalized anxiety disorder: Secondary | ICD-10-CM

## 2018-12-11 MED ORDER — RISPERIDONE 0.5 MG PO TABS: 0.5000 mg | ORAL_TABLET | Freq: Every day | ORAL | 0 refills | Status: DC

## 2018-12-11 MED ORDER — ALPRAZOLAM 0.25 MG PO TABS: 0.2500 mg | ORAL_TABLET | ORAL | 0 refills | Status: DC

## 2018-12-11 MED ORDER — SERTRALINE HCL 100 MG PO TABS: 100.0000 mg | ORAL_TABLET | Freq: Every day | ORAL | 0 refills | Status: DC

## 2018-12-11 NOTE — Progress Notes (Signed)
[facility] MD/PA/NP OP Progress Note  12/11/2018 10:10 AM Blake Richards  MRN:  [id number]  Chief Complaint: Had a good Christmas.  I met my family members and went to [place].  HPI: Blake Richards came for his follow-up appointment with his legal guardian.  According to guardian patient is doing very well and he has no episodes of agitation or anger.  Sometimes he talks to himself when I ask about his hallucination he admitted he like to talk to his deceased parents which comes and relaxes him.  Overall he has no issues with the medication.  He is sleeping good.  He denies any nervousness or any panic attacks.  Sometimes he get anxious because of severe weather but he denies any crying spells or any feeling of hopelessness.  Patient has autism spectrum disorder.  Sometimes he does preservation in his thinking.  However he appears calm and pleasant today.  He denies any suicidal thoughts or any homicidal thought.  He has no tremors, shakes or any EPS.  His energy level is good.  His appetite is okay.  His vital signs are stable.  Visit Diagnosis:    ICD-10-CM   1. 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 GAD (generalized anxiety disorder) F41.1 sertraline (ZOLOFT) 100 MG tablet    risperiDONE (RISPERDAL) 0.5 MG tablet    ALPRAZolam (XANAX) 0.25 MG tablet  3. Autism F84.0 risperiDONE (RISPERDAL) 0.5 MG tablet    Past Psychiatric History: Reviewed. As per legal guardian patient has no history of psychiatric inpatient treatment.  He has a history of anxiety and given prescription Effexor from the primary care physician.  It is unclear why Effexor was discontinued.  He [facility] and given Risperdal because of hallucination.  There is no history of violence or aggression.  Past Medical History:  Past Medical History:  Diagnosis Date  . Autism   . Epilepsy ([place])   . Hypertension    History reviewed. No pertinent surgical history.  Family Psychiatric History: Reviewed.  Family  History: History reviewed. No pertinent family history.  Social History:  Social History   Socioeconomic History  . Marital status: Single    Spouse name: Not on file  . Number of children: Not on file  . Years of education: Not on file  . Highest education level: Not on file  Occupational History  . Not on file  Social Needs  . Financial resource strain: Not on file  . Food insecurity:    Worry: Not on file    Inability: Not on file  . Transportation needs:    Medical: Not on file    Non-medical: Not on file  Tobacco Use  . Smoking status: Never Smoker  . Smokeless tobacco: Never Used  Substance and Sexual Activity  . Alcohol use: No  . Drug use: No  . Sexual activity: Not on file  Lifestyle  . Physical activity:    Days per week: Not on file    Minutes per session: Not on file  . Stress: Not on file  Relationships  . Social connections:    Talks on phone: Not on file    Gets together: Not on file    Attends religious service: Not on file    Active member of club or organization: Not on file    Attends meetings of clubs or organizations: Not on file    Relationship status: Not on file  Other Topics Concern  . Not on file  Social History Narrative  . Not on file   Vitals:   12/11/18 1003  Weight: 176 lb 12.8 oz (80.2 kg)  Height: 6' (1.829 m)   Allergies:  Allergies  Allergen Reactions  . Tropicamide Other (See Comments)    seizures  . Lactose Intolerance (Gi) Other (See Comments)    Upset stomach    Metabolic Disorder Labs: Lab Results  Component Value Date   HGBA1C 5.2 06/05/2018   No results found for: PROLACTIN Lab Results  Component Value Date   CHOL 165 06/05/2018   TRIG 95 06/05/2018   HDL 50 06/05/2018   LDLCALC 96 06/05/2018   Lab Results  Component Value Date   TSH 0.705 04/21/2017    Therapeutic Level Labs: No results found for: LITHIUM No results found for: VALPROATE No components found for:  CBMZ  Current  Medications: Current Outpatient Medications  Medication Sig Dispense Refill  . acetaZOLAMIDE (DIAMOX) 250 MG tablet Take 250 mg by mouth 2 (two) times daily.    Marland Kitchen ALPRAZolam (XANAX) 0.25 MG tablet Take 1 tablet (0.25 mg total) by mouth every morning. 90 tablet 0  . carbamazepine (TEGRETOL XR) 400 MG 12 hr tablet Take 800 mg by mouth 2 (two) times daily.    . cholecalciferol (VITAMIN D3) 25 MCG (1000 UT) tablet Take 1,000 Units by mouth daily.    Marland Kitchen levETIRAcetam (KEPPRA) 500 MG tablet Take 500-750 mg by mouth every 12 (twelve) hours. 750 morning and 500 mg in the evening    . levothyroxine (SYNTHROID, LEVOTHROID) 75 MCG tablet Take by mouth.    . risperiDONE (RISPERDAL) 0.5 MG tablet Take 1 tablet (0.5 mg total) by mouth at bedtime. 90 tablet 0  . sertraline (ZOLOFT) 100 MG tablet Take 1 tablet (100 mg total) by mouth daily. 90 tablet 0   No current facility-administered medications for this visit.      Musculoskeletal: Strength & Muscle Tone: within normal limits Gait & Station: normal Patient leans: N/A  Psychiatric Specialty Exam: ROS  Blood pressure 126/74, height 6' (1.829 m), weight 176 lb 12.8 oz (80.2 kg).Body mass index is 23.98 kg/m.  General Appearance: Well Groomed  Eye Contact:  Fair  Speech:  monotonus  Volume:  Normal  Mood:  Euthymic  Affect:  Flat  Thought Process:  Descriptions of Associations: Intact  Orientation:  Full (Time, Place, and Person)  Thought Content: talk to deceased parents when by himself   Suicidal Thoughts:  No  Homicidal Thoughts:  No  Memory:  Immediate;   Fair Recent;   Fair Remote;   Fair  Judgement:  Fair  Insight:  Fair  Psychomotor Activity:  Normal  Concentration:  Concentration: Fair and Attention Span: Fair  Recall:  [employer] of Knowledge: Fair  Language: Fair  Akathisia:  No  Handed:  Right  AIMS (if indicated): not done  Assets:  Desire for Improvement Housing Social Support  ADL's:  Intact  Cognition: WNL  Sleep:   Good   Screenings:   Assessment and Plan: Psychosis NOS.  Autism spectrum disorder.  Generalized anxiety disorder.  Patient is a stable on his current medication.  On time he talks to himself with his deceased parents when he is by himself which calms him down.  He has no side effects from the medication.  Patient and his legal guardian reluctant to cut down the medication as patient is doing very well.  Continue Risperdal 0.5 mg at bedtime, Zoloft 1 mg daily and Xanax 0.25  mg in the morning.  Discussed medication side effects and benefits.  Recommended to call us back if is any question or any concern.  Follow-up in 3 months.   [name], MD 12/11/2018, 10:10 AM

## 2019-01-02 ENCOUNTER — Other Ambulatory Visit (HOSPITAL_COMMUNITY): Payer: Self-pay | Admitting: Psychiatry

## 2019-01-15 ENCOUNTER — Emergency Department (HOSPITAL_COMMUNITY)
Admission: EM | Admit: 2019-01-15 | Discharge: 2019-01-16 | Disposition: A | Discharge status: Home / Self Care | Payer: Medicare Other | Attending: Emergency Medicine | Admitting: Emergency Medicine

## 2019-01-15 ENCOUNTER — Encounter (HOSPITAL_COMMUNITY): Payer: Self-pay

## 2019-01-15 DIAGNOSIS — R569 Unspecified convulsions: Secondary | ICD-10-CM | POA: Diagnosis present

## 2019-01-15 DIAGNOSIS — Z79899 Other long term (current) drug therapy: Secondary | ICD-10-CM | POA: Diagnosis not present

## 2019-01-15 DIAGNOSIS — E876 Hypokalemia: Secondary | ICD-10-CM | POA: Diagnosis not present

## 2019-01-15 DIAGNOSIS — I10 Essential (primary) hypertension: Secondary | ICD-10-CM | POA: Insufficient documentation

## 2019-01-15 DIAGNOSIS — E039 Hypothyroidism, unspecified: Secondary | ICD-10-CM | POA: Diagnosis not present

## 2019-01-15 LAB — CBG MONITORING, ED: GLUCOSE-CAPILLARY: 103 mg/dL — AB (ref 70–99)

## 2019-01-15 MED ORDER — LEVETIRACETAM IN NACL 500 MG/100ML IV SOLN
500.0000 mg | Freq: Once | INTRAVENOUS | Status: AC
  Administered 2019-01-16: 500 mg via INTRAVENOUS
  Filled 2019-01-15: qty 100

## 2019-01-15 NOTE — ED Notes (Signed)
While attempting to get pt dressed and ready for discharge, stood pt up to pull up his pants, pt slumping forward onto the bed hanging off and began having another seizure witnessed by this RN, assisted back into bed by staff,  pt post ictal, MD made aware  Pt caregiver Kristine Linea [phone], call with updates

## 2019-01-15 NOTE — ED Triage Notes (Signed)
Pt comes via [facility] for seizures, hx of the same, witnessed, lasted about 3 minutes, while eating dinner, possibly aspirated, no incontinence or oral trauma noted, pt still postictal, started a new medication today called Atorvastatin. Pt is autistic

## 2019-01-16 DIAGNOSIS — R569 Unspecified convulsions: Secondary | ICD-10-CM | POA: Diagnosis not present

## 2019-01-16 LAB — CBC WITH DIFFERENTIAL/PLATELET
Abs Immature Granulocytes: 0.04 10*3/uL (ref 0.00–0.07)
Basophils Absolute: 0 10*3/uL (ref 0.0–0.1)
Basophils Relative: 0 %
Eosinophils Absolute: 0.1 10*3/uL (ref 0.0–0.5)
Eosinophils Relative: 1 %
HCT: 39.2 % (ref 39.0–52.0)
Hemoglobin: 12.3 g/dL — ABNORMAL LOW (ref 13.0–17.0)
Immature Granulocytes: 0 %
Lymphocytes Relative: 8 %
Lymphs Abs: 0.7 10*3/uL (ref 0.7–4.0)
MCH: 30.4 pg (ref 26.0–34.0)
MCHC: 31.4 g/dL (ref 30.0–36.0)
MCV: 97 fL (ref 80.0–100.0)
Monocytes Absolute: 0.6 10*3/uL (ref 0.1–1.0)
Monocytes Relative: 6 %
NRBC: 0 % (ref 0.0–0.2)
Neutro Abs: 8.2 10*3/uL — ABNORMAL HIGH (ref 1.7–7.7)
Neutrophils Relative %: 85 %
Platelets: 308 10*3/uL (ref 150–400)
RBC: 4.04 MIL/uL — AB (ref 4.22–5.81)
RDW: 13.1 % (ref 11.5–15.5)
WBC: 9.7 10*3/uL (ref 4.0–10.5)

## 2019-01-16 LAB — BASIC METABOLIC PANEL
Anion gap: 9 (ref 5–15)
BUN: 12 mg/dL (ref 8–23)
CO2: 21 mmol/L — ABNORMAL LOW (ref 22–32)
Calcium: 8.3 mg/dL — ABNORMAL LOW (ref 8.9–10.3)
Chloride: 109 mmol/L (ref 98–111)
Creatinine, Ser: 1.02 mg/dL (ref 0.61–1.24)
GFR calc Af Amer: >60 mL/min (ref >60)
GFR calc non Af Amer: >60 mL/min (ref >60)
Glucose, Bld: 98 mg/dL (ref 70–99)
Potassium: 2.9 mmol/L — ABNORMAL LOW (ref 3.5–5.1)
SODIUM: 139 mmol/L (ref 135–145)

## 2019-01-16 MED ORDER — POTASSIUM CHLORIDE 10 MEQ/100ML IV SOLN
10.0000 meq | Freq: Once | INTRAVENOUS | Status: AC
  Administered 2019-01-16: 10 meq via INTRAVENOUS

## 2019-01-16 MED ORDER — POTASSIUM CHLORIDE 10 MEQ/100ML IV SOLN
INTRAVENOUS | Status: AC
  Filled 2019-01-16: qty 100

## 2019-01-16 MED ORDER — SODIUM CHLORIDE 0.9 % IV BOLUS (SEPSIS)
1000.0000 mL | Freq: Once | INTRAVENOUS | Status: AC
  Administered 2019-01-16: 1000 mL via INTRAVENOUS

## 2019-01-16 NOTE — ED Provider Notes (Signed)
[facility] EMERGENCY DEPARTMENT Provider Note   CSN: [id number] Arrival date & time: 01/15/19  2055    History   Chief Complaint Chief Complaint  Patient presents with  . Seizures   Level 5 caveat due to altered mental status HPI Blake Richards is a 63 y.o. male.     The history is provided by the patient and a caregiver.  Seizures  Seizure activity on arrival: no   Patient with history of autism, epilepsy, hypertension presents for seizure.  History is mainly provided by his guardian Mr. Blake Richards He has long history of seizures controlled with Keppra and Tegretol He has about 1-2 seizures per month. Tonight patient had a seizure that terminated spontaneously.  The guardian was not present, he was in the presence of another caregiver as he lives in a group home. Patient usually has a brief seizure and will have several hours of somnolence.  This appears similar to prior. No other acute issues per caregiver.  Patient is usually high functioning but does require to live in a group home Just had labs done on March 10 by PCP.  Just started atorvastatin for cholesterol issues Past Medical History:  Diagnosis Date  . Autism   . Epilepsy ([facility])   . Hypertension     Patient Active Problem List   Diagnosis Date Noted  . Hypothyroidism 10/06/2015  . Epilepsy ([facility]) 09/06/2015  . Autism 09/06/2015    History reviewed. No pertinent surgical history.      Home Medications    Prior to Admission medications   Medication Sig Start Date End Date Taking? Authorizing Provider  acetaZOLAMIDE (DIAMOX) 250 MG tablet Take 250 mg by mouth 2 (two) times daily.    [provider]  ALPRAZolam Prudy Feeler) 0.25 MG tablet Take 1 tablet (0.25 mg total) by mouth every morning. 12/11/18   Arfeen, Phillips Grout, MD  carbamazepine (TEGRETOL XR) 400 MG 12 hr tablet Take 800 mg by mouth 2 (two) times daily.    [provider]  cholecalciferol (VITAMIN D3) 25 MCG (1000 UT) tablet  Take 1,000 Units by mouth daily.    [provider]  levETIRAcetam (KEPPRA) 500 MG tablet Take 500-750 mg by mouth every 12 (twelve) hours. 750 morning and 500 mg in the evening    [provider]  levothyroxine (SYNTHROID, LEVOTHROID) 75 MCG tablet Take by mouth. 09/11/18   [provider]  risperiDONE (RISPERDAL) 0.5 MG tablet Take 1 tablet (0.5 mg total) by mouth at bedtime. 12/11/18   Arfeen, Phillips Grout, MD  sertraline (ZOLOFT) 100 MG tablet Take 1 tablet (100 mg total) by mouth daily. 12/11/18   Cleotis Nipper, MD    Family History No family history on file.  Social History Social History   Tobacco Use  . Smoking status: Never Smoker  . Smokeless tobacco: Never Used  Substance Use Topics  . Alcohol use: No  . Drug use: No     Allergies   Tropicamide and Lactose intolerance (gi)   Review of Systems Review of Systems  Unable to perform ROS: Mental status change  Neurological: Positive for seizures.     Physical Exam Updated Vital Signs BP 129/80   Pulse (!) 56   Temp (!) 97.5 F (36.4 C) (Oral)   Resp 14   SpO2 100%   Physical Exam CONSTITUTIONAL: Somnolent, no acute distress HEAD: Normocephalic/atraumatic, no signs of trauma EYES: EOMI/PERRL, no nystagmus ENMT: Mucous membranes moist, no oral laceration NECK: supple no meningeal  signs SPINE/BACK:entire spine nontender CV: S1/S2 noted, no murmurs/rubs/gallops noted LUNGS: Lungs are clear to auscultation bilaterally, no apparent distress ABDOMEN: soft, nontender, no rebound or guarding, bowel sounds noted throughout abdomen GU:no cva tenderness NEURO: Pt is somnolent but easily arousable, follows all commands.  No arm or leg drift noted EXTREMITIES: pulses normal/equal, full ROM, no deformities SKIN: warm, color normal PSYCH:  unable to assess  ED Treatments / Results  Labs (all labs ordered are listed, but only abnormal results are displayed) Labs Reviewed  BASIC METABOLIC PANEL -  Abnormal; Notable for the following components:      Result Value   Potassium 2.9 (*)    CO2 21 (*)    Calcium 8.3 (*)    All other components within normal limits  CBC WITH DIFFERENTIAL/PLATELET - Abnormal; Notable for the following components:   RBC 4.04 (*)    Hemoglobin 12.3 (*)    Neutro Abs 8.2 (*)    All other components within normal limits  CBG MONITORING, ED - Abnormal; Notable for the following components:   Glucose-Capillary 103 (*)    All other components within normal limits    EKG EKG Interpretation  Date/Time:  Wednesday January 15 2019 21:02:57 EDT Ventricular Rate:  57 PR Interval:    QRS Duration: 106 QT Interval:  458 QTC Calculation: 446 R Axis:   -36 Text Interpretation:  Sinus rhythm Prolonged PR interval Left axis deviation Low voltage, extremity leads No significant change since last tracing Confirmed by Blake Richards ([id number]) on 01/15/2019 11:22:22 PM   Radiology No results found.  Procedures Procedures   Medications Ordered in ED Medications  levETIRAcetam (KEPPRA) IVPB 500 mg/100 mL premix (0 mg Intravenous Stopped 01/16/19 0058)  sodium chloride 0.9 % bolus 1,000 mL (0 mLs Intravenous Stopped 01/16/19 0454)  potassium chloride 10 mEq in 100 mL IVPB (0 mEq Intravenous Stopped 01/16/19 0454)     Initial Impression / Assessment and Plan / ED Course  I have reviewed the triage vital signs and the nursing notes.  Pertinent labs results that were available during my care of the patient were reviewed by me and considered in my medical decision making (see chart for details).        12:15 AM Patient was about to be discharged and he had been in emergency department for several hours without seizures when he had a brief seizure.  No trauma reported, it terminated spontaneously Plan to treat with keppra, check labs and reassess No indication for neuroimaging at this time  Patient stable in ED.  He did well after Keppra.  He did have mild  hypokalemia and was given IV potassium He is awake and alert and at his baseline.  His caregiver has arrived who confirms patient at baseline.  Will discharge home. Final Clinical Impressions(s) / ED Diagnoses   Final diagnoses:  Seizure [facility])  Hypokalemia    ED Discharge Orders    None       Blake Rhine, MD 01/16/19 [phone]

## 2019-03-11 ENCOUNTER — Ambulatory Visit (INDEPENDENT_AMBULATORY_CARE_PROVIDER_SITE_OTHER): Payer: Medicare Other | Admitting: Psychiatry

## 2019-03-11 ENCOUNTER — Other Ambulatory Visit: Payer: Self-pay

## 2019-03-11 ENCOUNTER — Encounter (HOSPITAL_COMMUNITY): Payer: Self-pay | Admitting: Psychiatry

## 2019-03-11 DIAGNOSIS — F411 Generalized anxiety disorder: Secondary | ICD-10-CM | POA: Diagnosis not present

## 2019-03-11 DIAGNOSIS — F29 Unspecified psychosis not due to a substance or known physiological condition: Secondary | ICD-10-CM | POA: Diagnosis not present

## 2019-03-11 DIAGNOSIS — F840 Autistic disorder: Secondary | ICD-10-CM | POA: Diagnosis not present

## 2019-03-11 MED ORDER — RISPERIDONE 0.5 MG PO TABS: 0.5000 mg | ORAL_TABLET | Freq: Every day | ORAL | 0 refills | Status: DC

## 2019-03-11 MED ORDER — SERTRALINE HCL 100 MG PO TABS: 100.0000 mg | ORAL_TABLET | Freq: Every day | ORAL | 0 refills | Status: DC

## 2019-03-11 MED ORDER — ALPRAZOLAM 0.25 MG PO TABS: 0.2500 mg | ORAL_TABLET | ORAL | 0 refills | Status: DC

## 2019-03-11 NOTE — Progress Notes (Signed)
Virtual Visit via Telephone Note  I connected with Blake FordDavid Richards on 03/11/19 at 10:40 AM EDT by telephone and verified that I am speaking with the correct person using two identifiers.   I discussed the limitations, risks, security and privacy concerns of performing an evaluation and management service by telephone and the availability of in person appointments. I also discussed with the patient that there may be a patient responsible charge related to this service. The patient expressed understanding and agreed to proceed.   History of Present Illness: Patient was evaluated through phone session.  Patient has autism spectrum disorder and most of the information was obtained through Blake Richards.  As per director patient is doing much better on his medication.  He is still have episodes of talking to himself but he is not involved in agitation, anger, mood swing.  He is sleeping good.  He follows rules and routine of the facility.  Recently he was seen in the emergency room because of a seizure which she believe due to starting atorvastatin for his cholesterol.  His neurology was consulted and recommended to stop the atorvastatin.  Since then he has no seizures.  Patient reported no side effects of medication.  Patient responds to the question yes and no and he feels the current medicine working.  He reported no tremors or shakes.  He denies any suicidal thoughts or homicidal thought.  He is eating good and his energy level is good.  He reported no crying spells or any feeling of hopelessness.   Past Psychiatric History: Reviewed. As per legal guardian patient has no history of psychiatric inpatient treatment. He has a history of anxiety and given prescription Effexor from the primary care physician. It is unclear why Effexor was discontinued. He [facility] Donnie Ahoobin Bridges [facility] and given Risperdal because of hallucination. There is no history of  violence or aggression.  Recent Results (from the past 2160 hour(s))  CBG monitoring, ED     Status: Abnormal   Collection Time: 01/15/19  9:04 PM  Result Value Ref Range   Glucose-Capillary 103 (H) 70 - 99 mg/dL  Basic metabolic panel     Status: Abnormal   Collection Time: 01/15/19 11:47 PM  Result Value Ref Range   Sodium 139 135 - 145 mmol/L   Potassium 2.9 (L) 3.5 - 5.1 mmol/L   Chloride 109 98 - 111 mmol/L   CO2 21 (L) 22 - 32 mmol/L   Glucose, Bld 98 70 - 99 mg/dL   BUN 12 8 - 23 mg/dL   Creatinine, Ser 5.401.02 0.61 - 1.24 mg/dL   Calcium 8.3 (L) 8.9 - 10.3 mg/dL   GFR calc non Af Amer >60 >60 mL/min   GFR calc Af Amer >60 >60 mL/min   Anion gap 9 5 - 15    Comment: Performed at [facility], [address] [place], [place] [id number]  CBC with Differential/Platelet     Status: Abnormal   Collection Time: 01/15/19 11:47 PM  Result Value Ref Range   WBC 9.7 4.0 - 10.5 K/uL   RBC 4.04 (L) 4.22 - 5.81 MIL/uL   Hemoglobin 12.3 (L) 13.0 - 17.0 g/dL   HCT 14.739.2 82.939.0 - 56.252.0 %   MCV 97.0 80.0 - 100.0 fL   MCH 30.4 26.0 - 34.0 pg   MCHC 31.4 30.0 - 36.0 g/dL   RDW 13.013.1 86.511.5 - 78.415.5 %   Platelets 308 150 -  400 K/uL   nRBC 0.0 0.0 - 0.2 %   Neutrophils Relative % 85 %   Neutro Abs 8.2 (H) 1.7 - 7.7 K/uL   Lymphocytes Relative 8 %   Lymphs Abs 0.7 0.7 - 4.0 K/uL   Monocytes Relative 6 %   Monocytes Absolute 0.6 0.1 - 1.0 K/uL   Eosinophils Relative 1 %   Eosinophils Absolute 0.1 0.0 - 0.5 K/uL   Basophils Relative 0 %   Basophils Absolute 0.0 0.0 - 0.1 K/uL   Immature Granulocytes 0 %   Abs Immature Granulocytes 0.04 0.00 - 0.07 K/uL    Comment: Performed at [facility], [address], [place], [place] [address]   Observations/Objective: Mental status examination done on the phone.  Patient describes his mood "okay".  His speech is slow with decreased volume and tone.  He has poverty of thought content and he only responds yes and no in response to question.  He  admitted talk to himself some time to calm himself but he denies any suicidal thoughts or homicidal thought.  There were no flight of ideas or loose association.  His attention and concentration is fair.  He is alert and oriented x3.  His fund of knowledge is below average.  His cognition is fair.  His insight judgment is fair.  He reported no tremors, shakes or any EPS.  Assessment and Plan: Psychosis NOS.  Autism spectrum disorder.  Generalized anxiety disorder.  Patient is a stable on his current medication.  I reviewed blood work results from recent emergency room visits.  He has low potassium.  Patient is also reluctant to cut down or change his medication since it is working.  I will continue Risperdal 0.5 mg at bedtime, Zoloft 100 mg daily and Xanax 0.25 mg in the morning.  Discussed medication side effects and benefits.  Encouraged to continue follow-up with the neurology which is a scheduled for next month.  Follow-up in 3 months.  His pharmacy does not take E prescription and we will call to his pharmacy for refills.  Follow Up Instructions:    I discussed the assessment and treatment plan with the patient. The patient was provided an opportunity to ask questions and all were answered. The patient agreed with the plan and demonstrated an understanding of the instructions.   The patient was advised to call back or seek an in-person evaluation if the symptoms worsen or if the condition fails to improve as anticipated.  I provided 20 minutes of non-face-to-face time during this encounter.   Cleotis Nipper, MD

## 2019-03-26 ENCOUNTER — Telehealth (HOSPITAL_COMMUNITY): Payer: Self-pay

## 2019-03-26 NOTE — Telephone Encounter (Signed)
I received a call from patient's caregiver regarding his medications. I spoke with pharmacy and was informed that patient's Risperdal 0.5mg  and Zoloft 100mg  were already sent out and his Xanax 0.25mg  is being sent out tonight. She also requested his AVS from last visit on 03/11/19 be sent to her. I informed her of the status of his medications and that I am sending out avs.

## 2019-06-11 ENCOUNTER — Ambulatory Visit (HOSPITAL_COMMUNITY): Payer: Medicare Other | Admitting: Psychiatry

## 2019-06-11 ENCOUNTER — Other Ambulatory Visit: Payer: Self-pay

## 2019-06-19 ENCOUNTER — Encounter (HOSPITAL_COMMUNITY): Payer: Self-pay | Admitting: Psychiatry

## 2019-06-19 ENCOUNTER — Other Ambulatory Visit: Payer: Self-pay

## 2019-06-19 ENCOUNTER — Ambulatory Visit (INDEPENDENT_AMBULATORY_CARE_PROVIDER_SITE_OTHER): Payer: Medicare Other | Admitting: Psychiatry

## 2019-06-19 ENCOUNTER — Other Ambulatory Visit (HOSPITAL_COMMUNITY): Payer: Self-pay

## 2019-06-19 DIAGNOSIS — F840 Autistic disorder: Secondary | ICD-10-CM | POA: Diagnosis not present

## 2019-06-19 DIAGNOSIS — F29 Unspecified psychosis not due to a substance or known physiological condition: Secondary | ICD-10-CM

## 2019-06-19 DIAGNOSIS — F411 Generalized anxiety disorder: Secondary | ICD-10-CM

## 2019-06-19 MED ORDER — SERTRALINE HCL 100 MG PO TABS: 100.0000 mg | ORAL_TABLET | Freq: Every day | ORAL | 0 refills | Status: DC

## 2019-06-19 MED ORDER — RISPERIDONE 0.5 MG PO TABS: 0.5000 mg | ORAL_TABLET | Freq: Every day | ORAL | 0 refills | Status: DC

## 2019-06-19 MED ORDER — ALPRAZOLAM 0.25 MG PO TABS: 0.2500 mg | ORAL_TABLET | ORAL | 0 refills | Status: DC
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
## 2019-08-22 IMAGING — CT CT HEAD W/O CM
4 series · 16 of 47 positions shown, 18 images · non-contrast
Comparison: None.

CLINICAL DATA: Increasing seizure activity.

EXAM:
CT HEAD WITHOUT CONTRAST
TECHNIQUE: Contiguous axial images were obtained from the base of the skull
through the vertex without intravenous contrast.

[Series 3: head wo · axial · 0.45mm/px · z∈[-221,-111]mm · 7 of 30 slices shown, 9 images]
[im 4/30  brain]
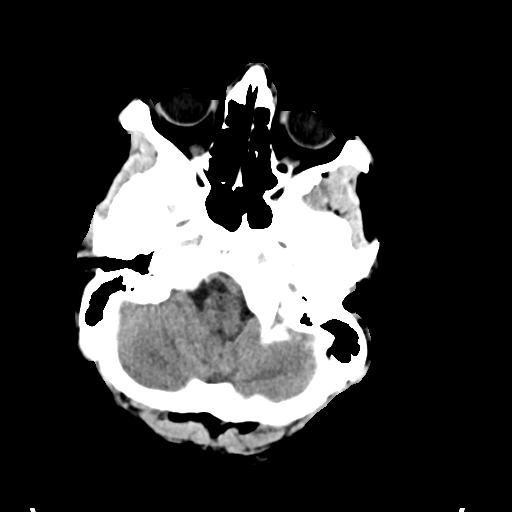
[im 4/30  bone]
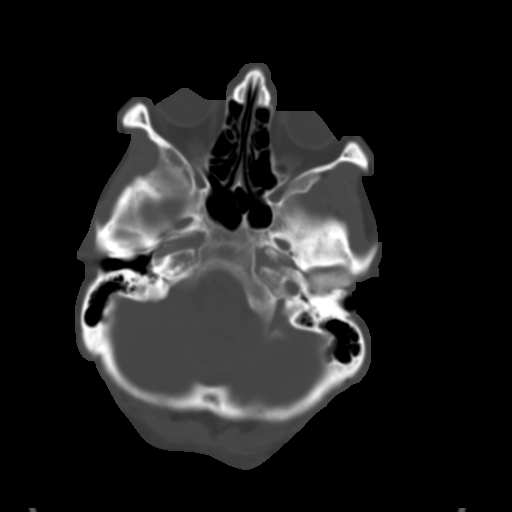
[im 8/30  brain]
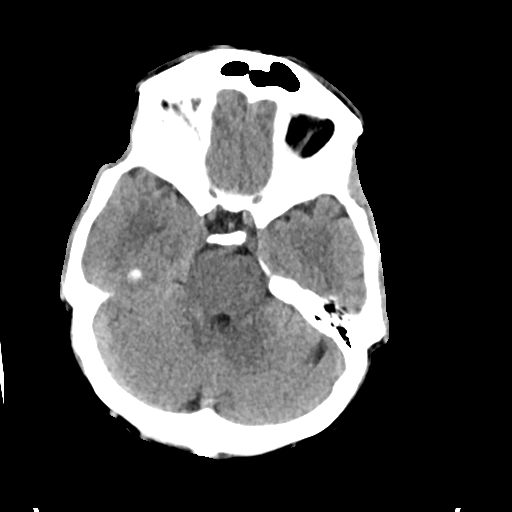
[im 11/30  brain]
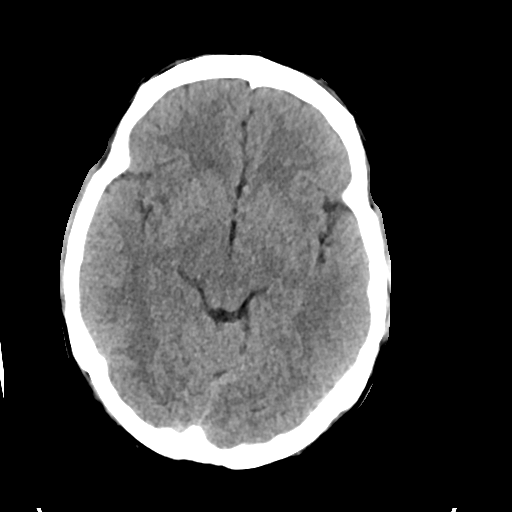
[im 15/30  brain]
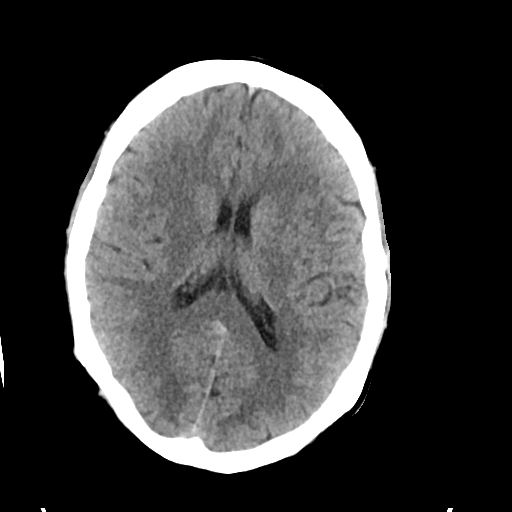
[im 19/30  brain]
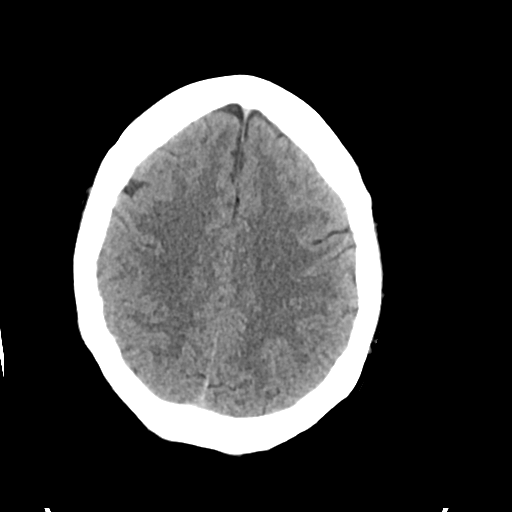
[im 19/30  bone]
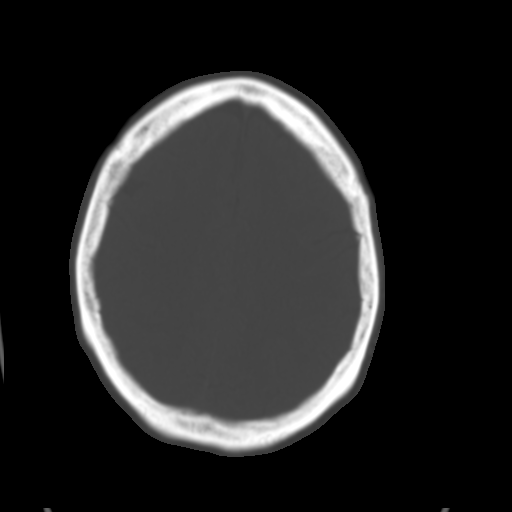
[im 22/30  brain]
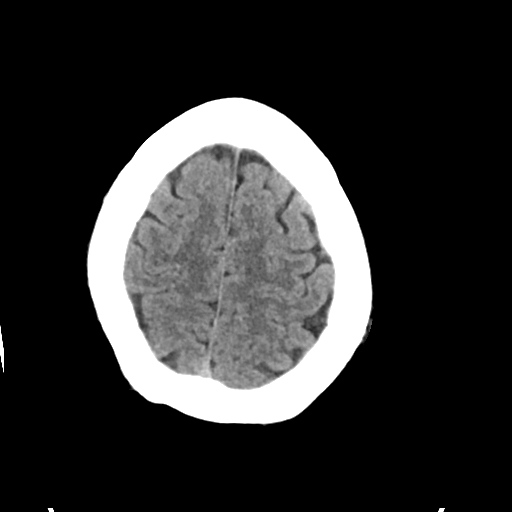
[im 26/30  brain]
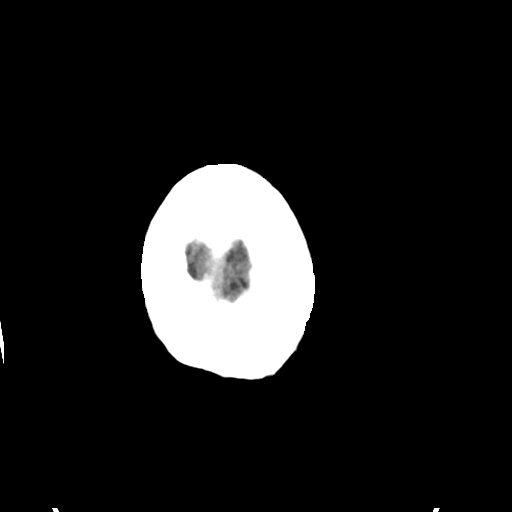

[Series 4: head bone · axial · 0.45mm/px · z∈[-222,-192]mm · 3 of 75 slices shown]
[im 8/75  bone]
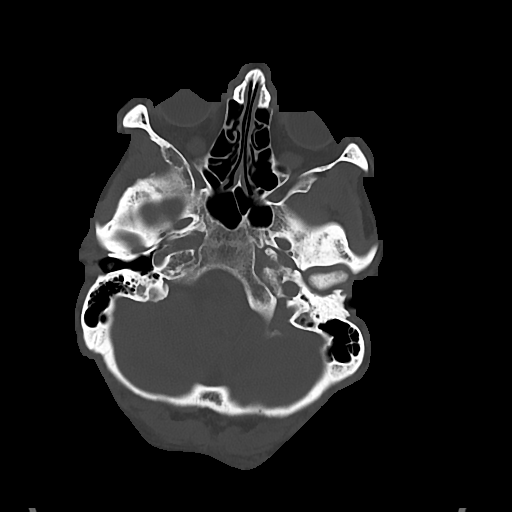
[im 15/75  bone]
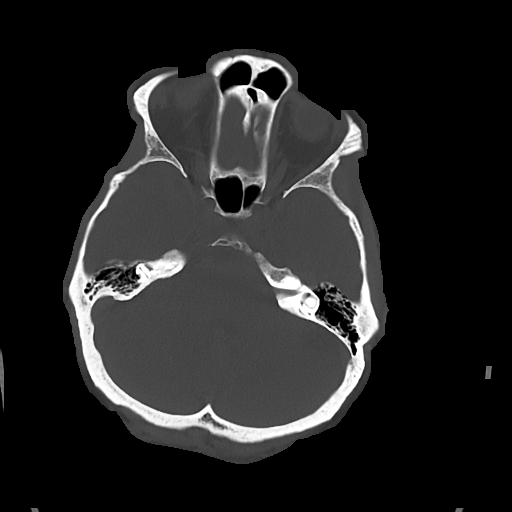
[im 23/75  bone]
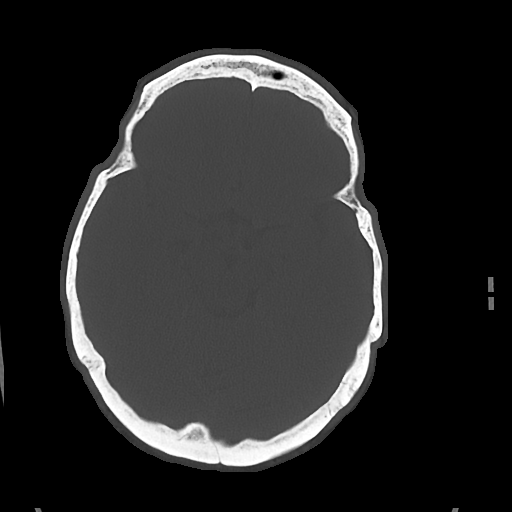

[Series 5: cor soft · coronal · 0.29mm/px · 3 of 67 slices shown]
[im 23/67  brain]
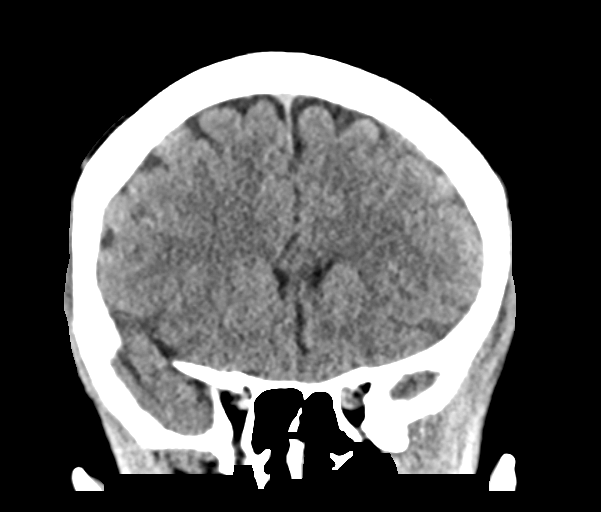
[im 30/67  brain]
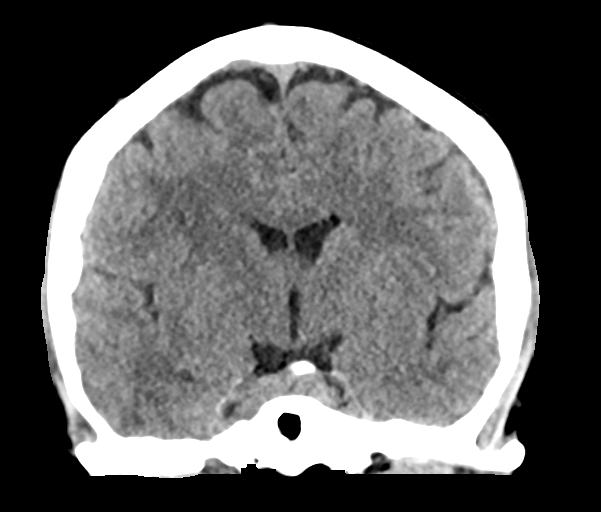
[im 37/67  brain]
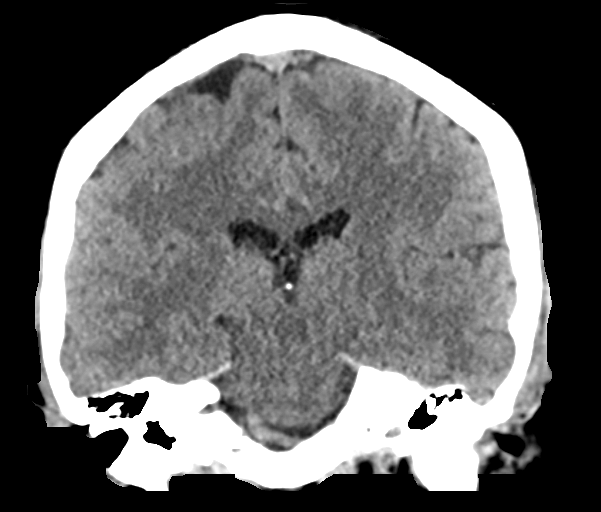

[Series 6: sag soft · sagittal · 0.29mm/px · 3 of 59 slices shown]
[im 20/59  brain]
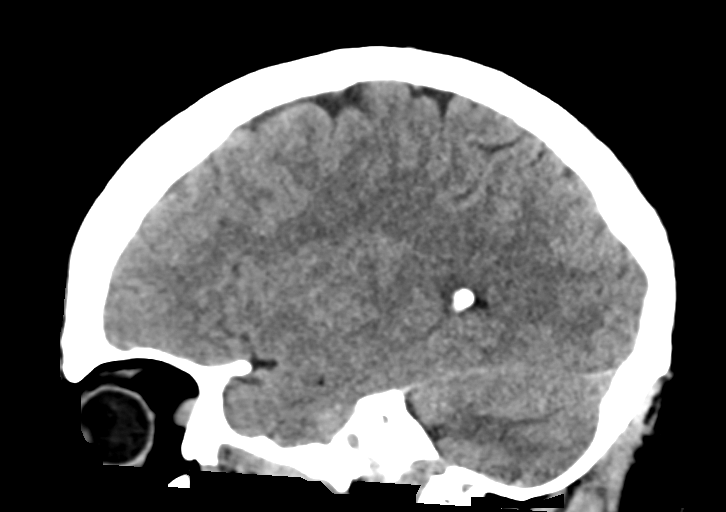
[im 30/59  brain]
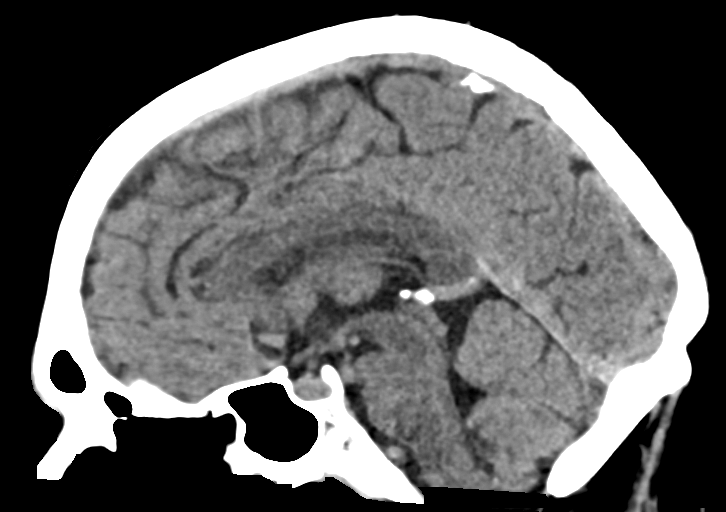
[im 39/59  brain]
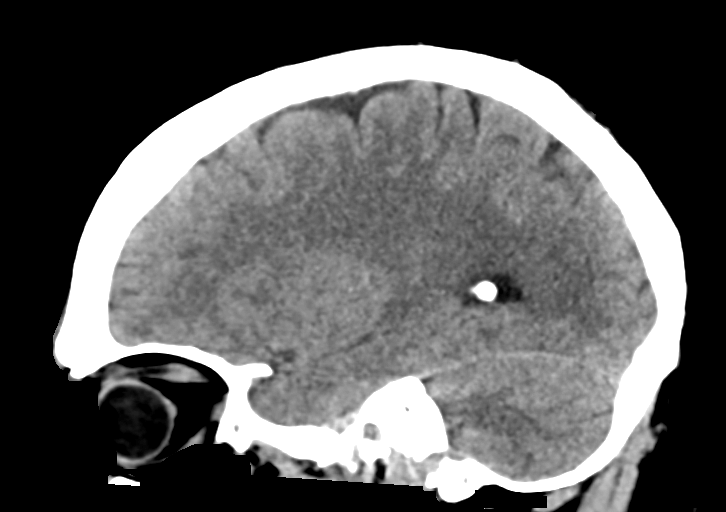

[16 of 47 positions shown; findings below may reference images not displayed]

FINDINGS: Brain: No evidence of acute infarction, hemorrhage, hydrocephalus,
extra-axial collection or mass lesion/mass effect. Incompletely
visualized right cerebellar tonsillar ectasia.

Vascular: No hyperdense vessel or unexpected calcification.

Skull: Normal. Negative for fracture or focal lesion.

Sinuses/Orbits: No acute finding.

Other: None.
IMPRESSION: No acute intracranial abnormality.

Incompletely visualized due to collimation right cerebellar
tonsillar ectasia.

## 2019-09-18 ENCOUNTER — Other Ambulatory Visit: Payer: Self-pay

## 2019-09-18 ENCOUNTER — Ambulatory Visit (INDEPENDENT_AMBULATORY_CARE_PROVIDER_SITE_OTHER): Payer: Medicare Other | Admitting: Psychiatry

## 2019-09-18 ENCOUNTER — Encounter (HOSPITAL_COMMUNITY): Payer: Self-pay | Admitting: Psychiatry

## 2019-09-18 DIAGNOSIS — F411 Generalized anxiety disorder: Secondary | ICD-10-CM

## 2019-09-18 DIAGNOSIS — F29 Unspecified psychosis not due to a substance or known physiological condition: Secondary | ICD-10-CM | POA: Diagnosis not present

## 2019-09-18 DIAGNOSIS — F840 Autistic disorder: Secondary | ICD-10-CM | POA: Diagnosis not present

## 2019-09-18 MED ORDER — ALPRAZOLAM 0.25 MG PO TABS: 0.2500 mg | ORAL_TABLET | ORAL | 0 refills | Status: DC

## 2019-09-18 MED ORDER — SERTRALINE HCL 100 MG PO TABS: 100.0000 mg | ORAL_TABLET | Freq: Every day | ORAL | 0 refills | Status: DC

## 2019-09-18 MED ORDER — RISPERIDONE 0.5 MG PO TABS: 0.5000 mg | ORAL_TABLET | Freq: Every day | ORAL | 0 refills | Status: DC
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
## 2019-09-19 ENCOUNTER — Other Ambulatory Visit (HOSPITAL_COMMUNITY): Payer: Self-pay | Admitting: *Deleted

## 2019-09-19 DIAGNOSIS — F840 Autistic disorder: Secondary | ICD-10-CM

## 2019-09-19 DIAGNOSIS — F29 Unspecified psychosis not due to a substance or known physiological condition: Secondary | ICD-10-CM

## 2019-09-19 DIAGNOSIS — F411 Generalized anxiety disorder: Secondary | ICD-10-CM

## 2019-09-19 MED ORDER — RISPERIDONE 0.5 MG PO TABS: 0.5000 mg | ORAL_TABLET | Freq: Every day | ORAL | 0 refills | Status: DC

## 2019-09-19 MED ORDER — SERTRALINE HCL 100 MG PO TABS: 100.0000 mg | ORAL_TABLET | Freq: Every day | ORAL | 0 refills | Status: DC

## 2019-09-19 MED ORDER — ALPRAZOLAM 0.25 MG PO TABS: 0.2500 mg | ORAL_TABLET | ORAL | 0 refills | Status: DC

## 2019-09-24 ENCOUNTER — Other Ambulatory Visit (HOSPITAL_COMMUNITY): Payer: Self-pay

## 2019-09-24 DIAGNOSIS — F411 Generalized anxiety disorder: Secondary | ICD-10-CM

## 2019-09-24 MED ORDER — SERTRALINE HCL 100 MG PO TABS: 100.0000 mg | ORAL_TABLET | Freq: Every day | ORAL | 0 refills | Status: DC

## 2019-09-24 MED ORDER — ALPRAZOLAM 0.25 MG PO TABS: 0.2500 mg | ORAL_TABLET | ORAL | 0 refills | Status: DC

## 2019-12-19 ENCOUNTER — Other Ambulatory Visit: Payer: Self-pay

## 2019-12-19 ENCOUNTER — Ambulatory Visit (INDEPENDENT_AMBULATORY_CARE_PROVIDER_SITE_OTHER): Payer: Medicare Other | Admitting: Psychiatry

## 2019-12-19 ENCOUNTER — Encounter (HOSPITAL_COMMUNITY): Payer: Self-pay | Admitting: Psychiatry

## 2019-12-19 DIAGNOSIS — F411 Generalized anxiety disorder: Secondary | ICD-10-CM

## 2019-12-19 DIAGNOSIS — F840 Autistic disorder: Secondary | ICD-10-CM | POA: Diagnosis not present

## 2019-12-19 DIAGNOSIS — F29 Unspecified psychosis not due to a substance or known physiological condition: Secondary | ICD-10-CM | POA: Diagnosis not present

## 2019-12-19 MED ORDER — SERTRALINE HCL 100 MG PO TABS: 100.0000 mg | ORAL_TABLET | Freq: Every day | ORAL | 0 refills | Status: DC

## 2019-12-19 MED ORDER — ALPRAZOLAM 0.25 MG PO TABS: 0.2500 mg | ORAL_TABLET | ORAL | 0 refills | Status: DC

## 2019-12-19 MED ORDER — RISPERIDONE 0.5 MG PO TABS: 0.5000 mg | ORAL_TABLET | Freq: Every day | ORAL | 0 refills | Status: DC

## 2019-12-19 NOTE — Progress Notes (Signed)
Virtual Visit via Telephone Note  I connected with Blake Richards on 12/19/19 at 10:00 AM EST by telephone and verified that I am speaking with the correct person using two identifiers.   I discussed the limitations, risks, security and privacy concerns of performing an evaluation and management service by telephone and the availability of in person appointments. I also discussed with the patient that there may be a patient responsible charge related to this service. The patient expressed understanding and agreed to proceed.   History of Present Illness: Patient was evaluated through phone session.  Staff from the facility [name] was also present there.  She reported that recently they have to move and get some adjustment in the facility and patient sometimes gets anxious and irritable but able to calm him down with verbal redirection.  He is sleeping good.  He talks to himself but denies any crying spells, severe mood swings.  He does require some assistance in his ADLs and staff does not leave him without any supervision.  His last seizure was in October 2020.  Overall staff feels the medicine is working and he is sleeping good.  He is eating okay.  Patient also reported that he is fine but he is not able to keep the conversation as he is a poor historian and has autism spectrum disorder.  He reported no tremors and staff do not notice any side effects.  Past Psychiatric History:Reviewed. As per legal guardian, no h/o inpatient treatment or violence. . H/O anxiety and given Effexor from PCP. It is unclear why Effexor was discontinued. He saw [name] at top priority and given Risperdal because of hallucination.    Psychiatric Specialty Exam: Physical Exam  Review of Systems  There were no vitals taken for this visit.There is no height or weight on file to calculate BMI.  General Appearance: NA  Eye Contact:  NA  Speech:  Slow  Volume:  Decreased  Mood:  Euthymic  Affect:   NA  Thought Process:  Linear and Descriptions of Associations: Intact  Orientation:  Other:  alert and oriented times two  Thought Content:  poverty of thought content  Suicidal Thoughts:  No  Homicidal Thoughts:  No  Memory:  Immediate;   Fair Recent;   Poor Remote;   Poor  Judgement:  Fair  Insight:  Fair  Psychomotor Activity:  NA  Concentration:  Concentration: Poor and Attention Span: Poor  Recall:  Poor  Fund of Knowledge:  Fair  Language:  Poor  Akathisia:  No  Handed:  Right  AIMS (if indicated):     Assets:  Desire for Improvement Housing Social Support  ADL's:  Impaired  Cognition:  Impaired,  Moderate  Sleep:   good      Assessment and Plan: Psychosis NOS.  Autism spectrum disorder.  Generalized anxiety disorder.  Discussed to use verbal redirection when he gets anxious since it helps him a lot.  Staff does not believe that he need more medication since most of the time he is doing fine.  Continue Zoloft 100 mg daily, Xanax 0.25 mg daily and Risperdal 0.5 mg at bedtime.  His appetite and weight is unchanged.  Recommended to call us back if he has any question or any concern.  Follow-up in 3 months.  Staff requires AVS summary for the records.  Follow Up Instructions:    I discussed the assessment and treatment plan with the patient. The patient was provided an opportunity to ask questions and  all were answered. The patient agreed with the plan and demonstrated an understanding of the instructions.   The patient was advised to call back or seek an in-person evaluation if the symptoms worsen or if the condition fails to improve as anticipated.  I provided 20 minutes of non-face-to-face time during this encounter.   [name], MD

## 2020-01-08 ENCOUNTER — Telehealth (HOSPITAL_COMMUNITY): Payer: Self-pay | Admitting: *Deleted

## 2020-01-08 NOTE — Telephone Encounter (Signed)
Most of her psychotropic medication has side effects.  He is on Risperdal for a long time and it is unlikely it is causing cognitive problems.  If he has memory impairment then should consult neurology.

## 2020-01-08 NOTE — Telephone Encounter (Signed)
Pt caregiver Gatha Mayer called asking if Risperdal may be changed to different med as she feels it is impacting his cognitive functioning. Stats that is has increasing frustration at being unable to complete tasks, then gets very upset. Caregiver acknowledges pt dx of autism but says this is different. Please review and advise.

## 2020-03-17 ENCOUNTER — Ambulatory Visit (HOSPITAL_COMMUNITY): Payer: Medicare Other | Admitting: Psychiatry

## 2020-04-08 ENCOUNTER — Telehealth (HOSPITAL_COMMUNITY): Payer: Medicare Other | Admitting: Psychiatry

## 2020-04-08 ENCOUNTER — Other Ambulatory Visit: Payer: Self-pay

## 2020-04-14 ENCOUNTER — Ambulatory Visit (HOSPITAL_COMMUNITY): Payer: Medicare Other | Admitting: Psychiatry

## 2020-04-22 ENCOUNTER — Other Ambulatory Visit: Payer: Self-pay

## 2020-04-22 ENCOUNTER — Encounter (HOSPITAL_COMMUNITY): Payer: Self-pay | Admitting: Psychiatry

## 2020-04-22 ENCOUNTER — Ambulatory Visit (INDEPENDENT_AMBULATORY_CARE_PROVIDER_SITE_OTHER): Payer: Medicare Other | Admitting: Psychiatry

## 2020-04-22 VITALS — BP 112/73 | HR 58 | Wt 166.0 lb

## 2020-04-22 DIAGNOSIS — F411 Generalized anxiety disorder: Secondary | ICD-10-CM

## 2020-04-22 DIAGNOSIS — R443 Hallucinations, unspecified: Secondary | ICD-10-CM | POA: Diagnosis not present

## 2020-04-22 DIAGNOSIS — F840 Autistic disorder: Secondary | ICD-10-CM | POA: Diagnosis not present

## 2020-04-22 MED ORDER — SERTRALINE HCL 100 MG PO TABS: 100.0000 mg | ORAL_TABLET | Freq: Every day | ORAL | 0 refills | Status: DC

## 2020-04-22 MED ORDER — ALPRAZOLAM 0.25 MG PO TABS: 0.2500 mg | ORAL_TABLET | ORAL | 0 refills | Status: DC

## 2020-04-22 NOTE — Progress Notes (Signed)
Virtual Visit via Telephone Note  I connected with Blake Richards on 04/22/20 at  2:00 PM EDT by telephone and verified that I am speaking with the correct person using two identifiers.   I discussed the limitations, risks, security and privacy concerns of performing an evaluation and management service by telephone and the availability of in person appointments. I also discussed with the patient that there may be a patient responsible charge related to this service. The patient expressed understanding and agreed to proceed.  Patient location; home Provider location; home office.  History of Present Illness: Patient is evaluated by phone session.  He is living in a home with Blake Richards for past 1 year.  During the COVID patient moved from group home and staying with Blake Richards.  She was also present during the session.  She mention that patient is doing well and reported no anger, agitation, crying spells or any behavior problem.  However she is concerned that sometimes patient is forgetful and does not follow instructions.  Patient has a lot of preservation in his thought process.  He is a poor historian.  He gets sometimes anxious and irritable but follow the verbal directions.  He talked to himself but denies any paranoia.  He has a history of seizures.  He required some assistance from ADL.  She is never left alone without any supervision.  Blake Richards mentioned that she like to stop the Risperdal if that helps his memory and cognition.  He was started on respite I will when years ago after having agitation and worsening of hallucinations.  Since he has been stable and there has been no recent worsening of symptoms we can try taking him off from the Risperdal.  He will continue Xanax and Zoloft.  His appetite is okay.  His vitals are stable.  He has not able to see PCP due to COVID because that requires in person visit.   Past Psychiatric History:Reviewed. As per legal guardian, no h/o  inpatient treatment or violence. . H/O anxiety and given Effexor from PCP. It is unclear why Effexor was discontinued. He saw Blake Richards at top priority and given Risperdal because of hallucination.    Psychiatric Specialty Exam: Physical Exam  Review of Systems  There were no vitals taken for this visit.There is no height or weight on file to calculate BMI.  General Appearance: NA  Eye Contact:  NA  Speech:  Slow  Volume:  Decreased  Mood:  Euthymic  Affect:  NA  Thought Process:  Descriptions of Associations: Intact  Orientation:  Full (Time, Place, and Person)  Thought Content:  Hallucinations: talk to himself and perservation  Suicidal Thoughts:  No  Homicidal Thoughts:  No  Memory:  Immediate;   Fair Recent;   Fair Remote;   Fair  Judgement:  Intact  Insight:  Fair  Psychomotor Activity:  NA  Concentration:  Concentration: Poor and Attention Span: Poor  Recall:  Poor  Fund of Knowledge:  Poor  Language:  Fair  Akathisia:  No  Handed:  Right  AIMS (if indicated):     Assets:  Desire for Improvement Housing Social Support  ADL's:  Impaired  Cognition:  Impaired,  Moderate  Sleep:   ok      Assessment and Plan: Hallucination.  Autism spectrum disorder.  Generalized anxiety disorder.  I discussed medication side effects and benefits.  We will consider stopping the risperidone after cutting down the dose to half tablet for 1 week.  I  suggested that if patient started to get more irritable, angry, more than usual to talk to himself then we may need to consider going back to Risperdal.  We will observe if stopping the Risperdal may help his cognition and reduce the preservation.  Continue Xanax 0.25 mg daily and Zoloft 100 mg daily.  So far patient has no side effects from the sleep medication.  [name] like to have a AVS summary for the records.  I recommend to call us back if she has any question or any concern.  Follow-up in 3 months.  Follow Up Instructions:     I discussed the assessment and treatment plan with the patient. The patient was provided an opportunity to ask questions and all were answered. The patient agreed with the plan and demonstrated an understanding of the instructions.   The patient was advised to call back or seek an in-person evaluation if the symptoms worsen or if the condition fails to improve as anticipated.  I provided 20 minutes of non-face-to-face time during this encounter.   Blake Nations, MD

## 2020-05-18 ENCOUNTER — Other Ambulatory Visit (HOSPITAL_COMMUNITY): Payer: Self-pay | Admitting: *Deleted

## 2020-07-19 ENCOUNTER — Telehealth (HOSPITAL_COMMUNITY): Payer: Medicare Other | Admitting: Psychiatry

## 2020-07-20 ENCOUNTER — Other Ambulatory Visit: Payer: Self-pay

## 2020-07-20 ENCOUNTER — Telehealth (INDEPENDENT_AMBULATORY_CARE_PROVIDER_SITE_OTHER): Payer: Medicare Other | Admitting: Psychiatry

## 2020-07-20 ENCOUNTER — Encounter (HOSPITAL_COMMUNITY): Payer: Self-pay | Admitting: Psychiatry

## 2020-07-20 VITALS — Wt 156.0 lb

## 2020-07-20 DIAGNOSIS — F840 Autistic disorder: Secondary | ICD-10-CM

## 2020-07-20 DIAGNOSIS — R443 Hallucinations, unspecified: Secondary | ICD-10-CM | POA: Diagnosis not present

## 2020-07-20 DIAGNOSIS — F411 Generalized anxiety disorder: Secondary | ICD-10-CM

## 2020-07-20 MED ORDER — ALPRAZOLAM 0.25 MG PO TABS: 0.2500 mg | ORAL_TABLET | ORAL | 1 refills | Status: DC

## 2020-07-20 MED ORDER — SERTRALINE HCL 100 MG PO TABS: 100.0000 mg | ORAL_TABLET | Freq: Every day | ORAL | 1 refills | Status: DC
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
## 2020-11-04 ENCOUNTER — Other Ambulatory Visit: Payer: Self-pay

## 2020-11-04 ENCOUNTER — Ambulatory Visit
Admission: EM | Admit: 2020-11-04 | Discharge: 2020-11-04 | Disposition: A | Discharge status: Home / Self Care | Payer: Medicare Other | Attending: Emergency Medicine | Admitting: Emergency Medicine

## 2020-11-04 ENCOUNTER — Encounter: Payer: Self-pay | Admitting: Emergency Medicine

## 2020-11-04 DIAGNOSIS — R220 Localized swelling, mass and lump, head: Secondary | ICD-10-CM | POA: Diagnosis not present

## 2020-11-04 HISTORY — DX: Disorder of thyroid, unspecified: E07.9

## 2020-11-04 MED ORDER — CEPHALEXIN 500 MG PO CAPS: 500.0000 mg | ORAL_CAPSULE | Freq: Four times a day (QID) | ORAL | 0 refills | Status: DC

## 2020-11-04 MED ORDER — PREDNISONE 10 MG PO TABS: 30.0000 mg | ORAL_TABLET | Freq: Every day | ORAL | 0 refills | Status: AC

## 2020-11-04 NOTE — ED Provider Notes (Signed)
[facility]    CSN: [id number] Arrival date & time: 11/04/20  1544      History   Chief Complaint Chief Complaint  Patient presents with  . Headache       . Facial Swelling    HPI Mayfield Schoene is a 64 y.o. male history of autism, epilepsy, presenting today for evaluation of right-sided facial swelling.  Reports woke up today with swelling underneath right eye with slight associated pain.  Denies any vision changes or pain with moving eye.  Denies any injury or trauma.  Denies fevers.  Caregiver reports he did recently have Tylenol and ibuprofen and these are the only new medicines of recently, unsure if he has tolerated previously.  Did report some possible slurred speech, but unsure if this is more related to the swelling on the right side.  Gait otherwise at baseline using extremities appropriately.  Patient mentally at baseline as well.  HPI  Past Medical History:  Diagnosis Date  . Autism   . Epilepsy (HCC)   . Hypertension   . Thyroid disease     Patient Active Problem List   Diagnosis Date Noted  . Hypothyroidism 10/06/2015  . Epilepsy (HCC) 09/06/2015  . Autism 09/06/2015    History reviewed. No pertinent surgical history.     Home Medications    Prior to Admission medications   Medication Sig Start Date End Date Taking? Authorizing Provider  cephALEXin (KEFLEX) 500 MG capsule Take 1 capsule (500 mg total) by mouth 4 (four) times daily. 11/04/20  Yes Damiyah Ditmars C, PA-C  predniSONE (DELTASONE) 10 MG tablet Take 3 tablets (30 mg total) by mouth daily with breakfast for 5 days. 11/04/20 11/09/20 Yes Martinez Boxx C, PA-C  acetaZOLAMIDE (DIAMOX) 250 MG tablet Take 250 mg by mouth 2 (two) times daily.    [provider]  ALPRAZolam Prudy Feeler) 0.25 MG tablet Take 1 tablet (0.25 mg total) by mouth every morning. 07/20/20   Arfeen, Phillips Grout, MD  carbamazepine (TEGRETOL XR) 400 MG 12 hr tablet Take 800 mg by mouth 2 (two) times daily.    [provider]  cholecalciferol (VITAMIN D3) 25 MCG (1000 UT) tablet Take 1,000 Units by mouth daily.    [provider]  fluticasone Aleda Grana) 50 MCG/ACT nasal spray  04/01/19   [provider]  levETIRAcetam (KEPPRA) 500 MG tablet Take 500-750 mg by mouth every 12 (twelve) hours. 750 morning and 500 mg in the evening    [provider]  levothyroxine (SYNTHROID, LEVOTHROID) 75 MCG tablet Take by mouth. 09/11/18   [provider]  pravastatin (PRAVACHOL) 20 MG tablet Take by mouth. 04/15/19   [provider]  sertraline (ZOLOFT) 100 MG tablet Take 1 tablet (100 mg total) by mouth daily. 07/20/20   Arfeen, Phillips Grout, MD    Family History History reviewed. No pertinent family history.  Social History Social History   Tobacco Use  . Smoking status: Never Smoker  . Smokeless tobacco: Never Used  Substance Use Topics  . Alcohol use: No  . Drug use: No     Allergies   Tropicamide and Lactose intolerance (gi)   Review of Systems Review of Systems  Constitutional: Negative for fatigue and fever.  HENT: Positive for facial swelling. Negative for congestion, sinus pressure and sore throat.   Eyes: Negative for photophobia, pain and visual disturbance.  Respiratory: Negative for cough and shortness of breath.   Cardiovascular: Negative for chest pain.  Gastrointestinal: Negative for  abdominal pain, nausea and vomiting.  Genitourinary: Negative for decreased urine volume and hematuria.  Musculoskeletal: Negative for myalgias, neck pain and neck stiffness.  Neurological: Positive for headaches. Negative for dizziness, syncope, facial asymmetry, speech difficulty, weakness, light-headedness and numbness.     Physical Exam Triage Vital Signs ED Triage Vitals  Enc Vitals Group     BP 11/04/20 1729 (!) 153/92     Pulse Rate 11/04/20 1729 80     Resp 11/04/20 1729 18     Temp 11/04/20 1729 98.2 F (36.8 C)     Temp Source 11/04/20 1729 Oral      SpO2 11/04/20 1729 96 %     Weight --      Height --      Head Circumference --      Peak Flow --      Pain Score 11/04/20 1730 5     Pain Loc --      Pain Edu? --      Excl. in GC? --    No data found.  Updated Vital Signs BP (!) 153/92 (BP Location: Left Arm)   Pulse 80   Temp 98.2 F (36.8 C) (Oral)   Resp 18   SpO2 96%   Visual Acuity Right Eye Distance:   Left Eye Distance:   Bilateral Distance:    Right Eye Near:   Left Eye Near:    Bilateral Near:     Physical Exam Vitals and nursing note reviewed.  Constitutional:      Appearance: He is well-developed and well-nourished.     Comments: No acute distress  HENT:     Head: Normocephalic and atraumatic.     Comments: Right infraorbital erythema and swelling extending slightly into right maxillary area, mild warmth and tenderness to palpation    Nose: Nose normal.     Mouth/Throat:     Comments: Oral mucosa pink and moist, no tonsillar enlargement or exudate. Posterior pharynx patent and nonerythematous, no uvula deviation or swelling. Normal phonation. Eyes:     Conjunctiva/sclera: Conjunctivae normal.  Cardiovascular:     Rate and Rhythm: Normal rate and regular rhythm.  Pulmonary:     Effort: Pulmonary effort is normal. No respiratory distress.     Comments: Breathing comfortably at rest, CTABL, no wheezing, rales or other adventitious sounds auscultated Abdominal:     General: There is no distension.  Musculoskeletal:        General: Normal range of motion.     Cervical back: Neck supple.  Skin:    General: Skin is warm and dry.  Neurological:     General: No focal deficit present.     Mental Status: He is alert and oriented to person, place, and time. Mental status is at baseline.     Cranial Nerves: No cranial nerve deficit.     Motor: No weakness.     Gait: Gait normal.     Comments: Patient A&O x3, cranial nerves II-XII grossly intact, strength at shoulders, hips and knees 5/5, equal bilaterally,  patellar reflex 1+ bilaterally.. Gait without abnormality.  Psychiatric:        Mood and Affect: Mood and affect normal.      UC Treatments / Results  Labs (all labs ordered are listed, but only abnormal results are displayed) Labs Reviewed - No data to display  EKG   Radiology No results found.  Procedures Procedures (including critical care time)  Medications Ordered in UC Medications - No data to  display  Initial Impression / Assessment and Plan / UC Course  I have reviewed the triage vital signs and the nursing notes.  Pertinent labs & imaging results that were available during my care of the patient were reviewed by me and considered in my medical decision making (see chart for details).     Facial swelling appears more allergic rather than infectious, but given mild tenderness and warmth opting to go ahead and place on Keflex along with 5-day course of prednisone.  Unclear culprit, possible ibuprofen.  No neuro deficits, patient appears at baseline, low suspicion of underlying stroke or intracranial abnormality.  Discussed warning signs with caregiver recommended to go to emergency room for CT scan if developing strokelike symptoms.  Continue to monitor swelling and redness, follow-up if not improving or worsening despite recommendations today.  Discussed strict return precautions. Patient verbalized understanding and is agreeable with plan.  Final Clinical Impressions(s) / UC Diagnoses   Final diagnoses:  Right facial swelling     Discharge Instructions     Begin prednisone 30 mg daily for the next 5 days Warm compresses to the eye Take Keflex 4 times daily for 5 days to cover for early infection around eye Return if any symptoms worsening or changing If developing worsening headache vision changes weakness facial drooping, go to the emergency room    ED Prescriptions    Medication Sig Dispense Auth. Provider   predniSONE (DELTASONE) 10 MG tablet Take 3  tablets (30 mg total) by mouth daily with breakfast for 5 days. 15 tablet Chirstopher Iovino C, PA-C   cephALEXin (KEFLEX) 500 MG capsule Take 1 capsule (500 mg total) by mouth 4 (four) times daily. 20 capsule Marijke Guadiana, [place] C, PA-C     PDMP not reviewed this encounter.   Lew Dawes, [place] 11/04/20 1816

## 2020-11-04 NOTE — Discharge Instructions (Signed)
Begin prednisone 30 mg daily for the next 5 days Warm compresses to the eye Take Keflex 4 times daily for 5 days to cover for early infection around eye Return if any symptoms worsening or changing If developing worsening headache vision changes weakness facial drooping, go to the emergency room

## 2020-11-04 NOTE — ED Triage Notes (Signed)
Pt here for HA today; pt was being given tylenol and ibuprofen; pt now with right sided eye swelling and facial swelling; per care giver pt with some slurred speech; swelling noted to right side of face

## 2021-01-11 ENCOUNTER — Telehealth (INDEPENDENT_AMBULATORY_CARE_PROVIDER_SITE_OTHER): Payer: Medicare HMO | Admitting: Psychiatry

## 2021-01-11 ENCOUNTER — Encounter (HOSPITAL_COMMUNITY): Payer: Self-pay | Admitting: Psychiatry

## 2021-01-11 ENCOUNTER — Other Ambulatory Visit: Payer: Self-pay

## 2021-01-11 VITALS — Wt 157.0 lb

## 2021-01-11 DIAGNOSIS — F411 Generalized anxiety disorder: Secondary | ICD-10-CM

## 2021-01-11 DIAGNOSIS — F840 Autistic disorder: Secondary | ICD-10-CM

## 2021-01-11 DIAGNOSIS — R443 Hallucinations, unspecified: Secondary | ICD-10-CM | POA: Diagnosis not present

## 2021-01-11 MED ORDER — SERTRALINE HCL 100 MG PO TABS: 100.0000 mg | ORAL_TABLET | Freq: Every day | ORAL | 1 refills | Status: DC

## 2021-01-11 MED ORDER — RISPERIDONE 0.25 MG PO TABS: ORAL_TABLET | ORAL | 0 refills | Status: DC

## 2021-01-11 MED ORDER — ALPRAZOLAM 0.25 MG PO TABS: 0.2500 mg | ORAL_TABLET | ORAL | 1 refills | Status: DC

## 2021-01-11 NOTE — Progress Notes (Signed)
Virtual Visit via Telephone Note  I connected with Blake Richards on 01/11/21 at  2:40 PM EST by telephone and verified that I am speaking with the correct person using two identifiers.  Location: Patient: Group Home Provider: Home Office   I discussed the limitations, risks, security and privacy concerns of performing an evaluation and management service by telephone and the availability of in person appointments. I also discussed with the patient that there may be a patient responsible charge related to this service. The patient expressed understanding and agreed to proceed.   History of Present Illness: Patient is evaluated by phone session.  He is a poor historian and lives in a group home.  I also spoke to the group home staff [name] who is helpful to provide information.  She told there are times when patient gets very anxious irritable and excited when he goes outside to see his family member.  However he is sleeping good and eating okay.  He sometimes talks to himself and required limit setting but there has been no recent aggression, violence.  He needs redirection and reminders for hygiene.  He is compliant with Xanax and Zoloft.  In the past she used to take Risperdal but it has been discontinued.  Staff noticed occasionally he gets upset and irritated and wondering if we can provide as needed medicine to calm him down if needed.  He has no tremors, shakes or any EPS.  He is seeing his PCP Dr. August Saucer and Dr. Despina Hidden for other health needs.  He is taking Tegretol 800 mg a day along with Xanax and Zoloft and thyroid medication.  Past Psychiatric History: As per legal guardian, no h/oinpatient treatmentor violence.. H/Oanxiety and given Effexor fromPCP.It is unclear why Effexor was discontinued. He saw [name] at [facility] and given Risperdal because of hallucination.     Psychiatric Specialty Exam: Physical Exam  Review of Systems  There were no vitals taken for  this visit.There is no height or weight on file to calculate BMI.  General Appearance: NA  Eye Contact:  NA  Speech:  Slow  Volume:  Decreased  Mood:  I am fine  Affect:  NA  Thought Process:  Descriptions of Associations: Intact  Orientation:  Full (Time, Place, and Person)  Thought Content:  sometimes talk to himself  Suicidal Thoughts:  No  Homicidal Thoughts:  No  Memory:  Immediate;   Fair Recent;   Fair Remote;   Fair  Judgement:  Fair  Insight:  Shallow  Psychomotor Activity:  NA  Concentration:  Concentration: Fair and Attention Span: Poor  Recall:  Poor  Fund of Knowledge:  Poor  Language:  Fair  Akathisia:  No  Handed:  Right  AIMS (if indicated):     Assets:  Desire for Improvement Housing Social Support  ADL's:  Impaired  Cognition:  Impaired,  Moderate  Sleep:   ok      Assessment and Plan: Hallucinations.  Autism spectrum disorder.  Anxiety.  Recommend to restart Risperdal 0.25 mg for severe anxiety and irritability and hallucination.  Continue Xanax 0.25 mg daily and Zoloft 100 mg daily.  We will get the blood work results from his PCP.  Recommended to call us back if he has any question or any concern.  Follow-up in 6 months.    Follow Up Instructions:    I discussed the assessment and treatment plan with the patient. The patient was provided an opportunity to ask questions and all  were answered. The patient agreed with the plan and demonstrated an understanding of the instructions.   The patient was advised to call back or seek an in-person evaluation if the symptoms worsen or if the condition fails to improve as anticipated.  I provided 22 minutes of non-face-to-face time during this encounter.   Cleotis Nipper, MD

## 2021-03-15 ENCOUNTER — Other Ambulatory Visit (HOSPITAL_COMMUNITY): Payer: Self-pay | Admitting: *Deleted

## 2021-03-15 ENCOUNTER — Other Ambulatory Visit (HOSPITAL_COMMUNITY): Payer: Self-pay | Admitting: Psychiatry

## 2021-03-15 DIAGNOSIS — R443 Hallucinations, unspecified: Secondary | ICD-10-CM

## 2021-03-15 DIAGNOSIS — F411 Generalized anxiety disorder: Secondary | ICD-10-CM

## 2021-03-15 MED ORDER — RISPERIDONE 0.25 MG PO TABS: ORAL_TABLET | ORAL | 0 refills | Status: DC

## 2021-07-07 ENCOUNTER — Other Ambulatory Visit (HOSPITAL_COMMUNITY): Payer: Self-pay | Admitting: Psychiatry

## 2021-07-07 DIAGNOSIS — F411 Generalized anxiety disorder: Secondary | ICD-10-CM

## 2021-07-12 ENCOUNTER — Telehealth (HOSPITAL_COMMUNITY): Payer: Medicare HMO | Admitting: Psychiatry

## 2021-07-12 ENCOUNTER — Other Ambulatory Visit: Payer: Self-pay

## 2021-07-15 ENCOUNTER — Other Ambulatory Visit: Payer: Self-pay

## 2021-07-15 ENCOUNTER — Telehealth (HOSPITAL_COMMUNITY): Payer: Medicare HMO | Admitting: Psychiatry

## 2021-08-06 ENCOUNTER — Other Ambulatory Visit (HOSPITAL_COMMUNITY): Payer: Self-pay | Admitting: Psychiatry

## 2021-08-06 DIAGNOSIS — F411 Generalized anxiety disorder: Secondary | ICD-10-CM

## 2021-08-15 ENCOUNTER — Telehealth (HOSPITAL_COMMUNITY): Payer: Self-pay | Admitting: Psychiatry

## 2021-08-15 ENCOUNTER — Other Ambulatory Visit: Payer: Self-pay

## 2021-08-15 ENCOUNTER — Telehealth (HOSPITAL_COMMUNITY): Payer: Medicare HMO | Admitting: Psychiatry

## 2021-08-15 NOTE — Telephone Encounter (Signed)
Patient had appointment today at 4 pm. I called number listed in Epic [phone] and left voicemail as no one pick up the phone. I also called Irven Baltimore at [phone] as apparently patient is in this group home but one pick up the phone. Voicemail left to call back and schedule appointment.

## 2021-08-16 ENCOUNTER — Other Ambulatory Visit: Payer: Self-pay

## 2021-08-16 ENCOUNTER — Telehealth (HOSPITAL_BASED_OUTPATIENT_CLINIC_OR_DEPARTMENT_OTHER): Payer: Medicare HMO | Admitting: Psychiatry

## 2021-08-16 DIAGNOSIS — F840 Autistic disorder: Secondary | ICD-10-CM | POA: Diagnosis not present

## 2021-08-16 DIAGNOSIS — R443 Hallucinations, unspecified: Secondary | ICD-10-CM

## 2021-08-16 DIAGNOSIS — F411 Generalized anxiety disorder: Secondary | ICD-10-CM

## 2021-08-16 MED ORDER — RISPERIDONE 0.25 MG PO TABS: ORAL_TABLET | ORAL | 5 refills | Status: DC

## 2021-08-16 MED ORDER — SERTRALINE HCL 100 MG PO TABS: 100.0000 mg | ORAL_TABLET | Freq: Every day | ORAL | 5 refills | Status: DC

## 2021-08-16 MED ORDER — ALPRAZOLAM 0.25 MG PO TABS: ORAL_TABLET | ORAL | 5 refills | Status: DC

## 2021-08-16 NOTE — Progress Notes (Signed)
Virtual Visit via Telephone Note  I connected with Blake Richards on 08/16/21 at 10:20 AM EDT by telephone and verified that I am speaking with the correct person using two identifiers.  Location: Patient: Group Home Provider: Home Office   I discussed the limitations, risks, security and privacy concerns of performing an evaluation and management service by telephone and the availability of in person appointments. I also discussed with the patient that there may be a patient responsible charge related to this service. The patient expressed understanding and agreed to proceed.   History of Present Illness: Patient is evaluated by phone session.  He is now in a [facility] supervised living group home in [place].  He has been living there since June.  I spoke to the staff Blake Richards at [phone] who is involved in the direct care of the patient.  He told patient has been stable but there are times when he talks loud and his tone get increased but no agitation, anger or behavior problem.  He does talk to himself but no aggression or violence.  He does go to sleep on time and wake up every morning 630.  So far he is following the routine and there has been no disruption.  He does take his medication but is Risperdal, Xanax and Zoloft.  He does get family visit regularly and he has upcoming visit end of this month.  His Blake Richards currently takes him to the dentist appointment.  His appetite is okay.  As per staff member weight is unchanged from the past.  As per staff he has no tremor or shakes or any EPS.  Patient denies any suicidal thought to be admitted sometime talk to himself that calmed him down.  Patient has autism spectrum disorder.  He feels comfortable at the new place and he is trying to make friends but keeping his distance.  Past Psychiatric History:  As per legal guardian, no h/o inpatient treatment or violence. .  H/O anxiety and given Effexor from PCP. It is unclear why Effexor was discontinued.   He saw Blake Richards at [facility] and given Risperdal because of hallucination.      Psychiatric Specialty Exam: Physical Exam  Review of Systems  There were no vitals taken for this visit.There is no height or weight on file to calculate BMI.  General Appearance: NA  Eye Contact:  NA  Speech:  Slow  Volume:  Decreased  Mood:   I am happy here  Affect:  NA  Thought Process:  Descriptions of Associations: Intact  Orientation:  Full (Time, Place, and Person)  Thought Content:   talking to self some times  Suicidal Thoughts:  No  Homicidal Thoughts:  No  Memory:  Immediate;   Fair Recent;   Fair Remote;   Fair  Judgement:  Fair  Insight:  Shallow  Psychomotor Activity:  NA  Concentration:  Concentration: Fair and Attention Span: Poor  Recall:  Poor  Fund of Knowledge:  Fair  Language:  Fair  Akathisia:  No  Handed:  Right  AIMS (if indicated):     Assets:  Desire for Improvement Housing Social Support  ADL's:  Impaired  Cognition:  Impaired,  Moderate  Sleep:   ok      Assessment and Plan: Hallucination.  Generalized anxiety disorder.  Autism spectrum disorder.  Since back on Risperdal he is stable.  There has been no disruption, aggression, agitation.  His last blood work was done in April of  and her hemoglobin A1c is normal.  We will continue Risperdal 0.25 mg to help the hallucination, continue Zoloft 100 mg daily and Xanax 0.5 mg during the day for anxiety.  I advised to stop to call us back if there is any question, concern or worsening of the symptoms.  Follow up in 6 months.  Group home is now using [facility] in [place].  Follow Up Instructions:    I discussed the assessment and treatment plan with the patient. The patient was provided an opportunity to ask questions and all were answered. The patient agreed with the plan and demonstrated an understanding of the instructions.   The patient was advised to call back or seek an in-person evaluation if the  symptoms worsen or if the condition fails to improve as anticipated.  I provided 22 minutes of non-face-to-face time during this encounter.   Blake Nipper, MD

## 2021-09-05 ENCOUNTER — Other Ambulatory Visit (HOSPITAL_COMMUNITY): Payer: Self-pay | Admitting: Psychiatry

## 2021-09-05 DIAGNOSIS — F411 Generalized anxiety disorder: Secondary | ICD-10-CM

## 2021-09-26 ENCOUNTER — Other Ambulatory Visit (HOSPITAL_COMMUNITY): Payer: Self-pay | Admitting: Psychiatry

## 2021-09-26 DIAGNOSIS — F411 Generalized anxiety disorder: Secondary | ICD-10-CM

## 2021-10-24 ENCOUNTER — Other Ambulatory Visit (HOSPITAL_COMMUNITY): Payer: Self-pay | Admitting: Psychiatry

## 2021-10-24 DIAGNOSIS — F411 Generalized anxiety disorder: Secondary | ICD-10-CM

## 2021-11-14 ENCOUNTER — Telehealth (HOSPITAL_COMMUNITY): Payer: Medicare HMO | Admitting: Psychiatry

## 2022-02-08 ENCOUNTER — Encounter (HOSPITAL_COMMUNITY): Payer: Self-pay | Admitting: Psychiatry

## 2022-02-08 ENCOUNTER — Telehealth (HOSPITAL_BASED_OUTPATIENT_CLINIC_OR_DEPARTMENT_OTHER): Payer: Medicare HMO | Admitting: Psychiatry

## 2022-02-08 VITALS — Wt 178.0 lb

## 2022-02-08 DIAGNOSIS — F840 Autistic disorder: Secondary | ICD-10-CM

## 2022-02-08 DIAGNOSIS — F411 Generalized anxiety disorder: Secondary | ICD-10-CM | POA: Diagnosis not present

## 2022-02-08 DIAGNOSIS — R443 Hallucinations, unspecified: Secondary | ICD-10-CM

## 2022-02-08 MED ORDER — ALPRAZOLAM 0.25 MG PO TABS: ORAL_TABLET | ORAL | 5 refills | Status: DC

## 2022-02-08 MED ORDER — SERTRALINE HCL 100 MG PO TABS: 100.0000 mg | ORAL_TABLET | Freq: Every day | ORAL | 5 refills | Status: DC

## 2022-02-08 NOTE — Progress Notes (Signed)
Virtual Visit via Telephone Note ? ?I connected with Blake Richards on 02/08/22 at 10:00 AM EDT by telephone and verified that I am speaking with the correct person using two identifiers. ? ?Location: ?Patient: Group Home ?Provider: Office ?  ?I discussed the limitations, risks, security and privacy concerns of performing an evaluation and management service by telephone and the availability of in person appointments. I also discussed with the patient that there may be a patient responsible charge related to this service. The patient expressed understanding and agreed to proceed. ? ? ?History of Present Illness: ?Patient is evaluated by phone session.  He is in a group home and his caretaker Nilda Riggs at [phone] is involved in his treatment plan.  Patient is superficially cooperative and talk to either yes or no.  As per staff he still talks to himself and some time he is loud and he watch TV but no behavior problem.  He has not given Risperdal for past few months because most of the time his behavior is manageable by verbal redirection.  He is taking Zoloft and Xanax every day.  His family does visit him on a regular basis.  Patient admitted sometimes hearing voices but he talked to himself and that calm him down.  He denies any suicidal thoughts, anger, crying spells.  He sleeps good.  Staff notices personal hygiene is not up to date and sometime he has neglect killing himself.  Patient feels comfortable at group home and he has no major concerns.  He had a visit to his PCP Dr. Queen Blossom and he has blood work.  His hemoglobin A1c is 5.6 and BUN 16 and creatinine 0.88 in November 2022.  Patient reported no tremors.  Staff reported that his behavior is manageable.  His appetite is okay.  He admitted weight gain because of not walking or exercise. ?   ?Past Psychiatric History:  ?As per legal guardian, no h/o inpatient treatment or violence. .  H/O anxiety and given Effexor from PCP. It is unclear why Effexor  was discontinued.  He saw [name] at [facility] and given Risperdal because of hallucination.   ? ?Psychiatric Specialty Exam: ?Physical Exam  ?Review of Systems  ?Weight 178 lb (80.7 kg).There is no height or weight on file to calculate BMI.  ?General Appearance: NA  ?Eye Contact:  NA  ?Speech:  Slow  ?Volume:  Decreased  ?Mood:   I am fine  ?Affect:  NA  ?Thought Process:  Descriptions of Associations: Intact  ?Orientation:  Full (Time, Place, and Person)  ?Thought Content:   talk to himself or towards TV. But not agitation or anger  ?Suicidal Thoughts:  No  ?Homicidal Thoughts:  No  ?Memory:  Immediate;   Fair ?Recent;   Fair ?Remote;   Fair  ?Judgement:  Fair  ?Insight:  Shallow  ?Psychomotor Activity:  NA  ?Concentration:  Concentration: Fair and Attention Span: Fair  ?Recall:  Fair  ?Fund of Knowledge:  Fair  ?Language:  Fair  ?Akathisia:  No  ?Handed:  Right  ?AIMS (if indicated):     ?Assets:  Desire for Improvement ?Housing ?Social Support  ?ADL's:  Impaired  ?Cognition:  Impaired,  Mild  ?Sleep:   ok  ? ? ? ? ?Assessment and Plan: ?Hallucinations.  Generalized anxiety disorder.  Autism spectrum disorder. ? ?Patient has not given Resporal in the past 3 months because his hallucination is manageable by verbal redirection.  He is not aggressive.  I reviewed blood  work results.  Recommend to continue Zoloft 100 mg daily and Xanax 0.25 mg daily.  We will not refill Risperdal 0.25 mg at this time as patient is not taking every day.  I recommended to call us back if is any question or any concern.  Follow-up in 6 months.  Patient caretaker preferred to have this prescription sent to family pharmacy at [facility] since they are not using [facility] in [place] anymore. ? ?Follow Up Instructions: ? ?  ?I discussed the assessment and treatment plan with the patient. The patient was provided an opportunity to ask questions and all were answered. The patient agreed with the plan and demonstrated an  understanding of the instructions. ?  ?The patient was advised to call back or seek an in-person evaluation if the symptoms worsen or if the condition fails to improve as anticipated. ? ?I provided 20 minutes of non-face-to-face time during this encounter. ? ? ?Cleotis Nipper, MD  ?

## 2022-08-10 ENCOUNTER — Encounter (HOSPITAL_COMMUNITY): Payer: Self-pay | Admitting: Psychiatry

## 2022-08-10 ENCOUNTER — Telehealth (HOSPITAL_BASED_OUTPATIENT_CLINIC_OR_DEPARTMENT_OTHER): Payer: Medicare HMO | Admitting: Psychiatry

## 2022-08-10 VITALS — Wt 191.0 lb

## 2022-08-10 DIAGNOSIS — F411 Generalized anxiety disorder: Secondary | ICD-10-CM

## 2022-08-10 DIAGNOSIS — F840 Autistic disorder: Secondary | ICD-10-CM | POA: Diagnosis not present

## 2022-08-10 DIAGNOSIS — R443 Hallucinations, unspecified: Secondary | ICD-10-CM | POA: Diagnosis not present

## 2022-08-10 MED ORDER — SERTRALINE HCL 100 MG PO TABS: 100.0000 mg | ORAL_TABLET | Freq: Every day | ORAL | 5 refills | Status: DC

## 2022-08-10 MED ORDER — ALPRAZOLAM 0.25 MG PO TABS: ORAL_TABLET | ORAL | 5 refills | Status: DC

## 2022-08-10 NOTE — Progress Notes (Signed)
Virtual Visit via Telephone Note  I connected with Blake Richards on 08/10/22 at 10:20 AM EDT by telephone and verified that I am speaking with the correct person using two identifiers.  Location: Patient: Group Home Provider: Home Office   I discussed the limitations, risks, security and privacy concerns of performing an evaluation and management service by telephone and the availability of in person appointments. I also discussed with the patient that there may be a patient responsible charge related to this service. The patient expressed understanding and agreed to proceed.   History of Present Illness: Patient is evaluated by phone session.  His caretaker Blake Richards helped the conversation.  Patient lives in a group home and he is a poor historian.  As per staff he is not aggressive or violent but is still talked to himself and sometimes scream and loud and watch TV.  He sleeps good and he eats 3 times per day.  His personal hygiene remains a challenge as he has impaired ADLs.  However he is getting along with the staff and able to follow the directions.  Patient told his mood is fine and he is comfortable at group home and he has no problem.  He had a visit with his PCP Dr. Earnest Richards and now he is taking amlodipine to help his blood pressure.  Patient denies any hallucination or paranoia however he does talk to himself when he get upset and that comes and down.  His memory, attention, concentration is limited.  He denies any tremors or shakes.  As per staff progress.  All has not given in more than a year.  He takes Xanax and Zoloft.  He has no tremor or shakes.  Past Psychiatric History:  As per legal guardian, no h/o inpatient treatment or violence. .  H/O anxiety and given Effexor from PCP. It is unclear why Effexor was discontinued.  He saw Blake Richards at top priority and given Risperdal because of hallucination.     Psychiatric Specialty Exam: Physical Exam  Review of Systems  Weight 191  lb (86.6 kg).There is no height or weight on file to calculate BMI.  General Appearance: NA  Eye Contact:  NA  Speech:  Slow  Volume:  Decreased  Mood:   I am ok  Affect:  NA  Thought Process:  Descriptions of Associations: Intact  Orientation:  Full (Time, Place, and Person)  Thought Content:   talk to himself, loud at times  Suicidal Thoughts:  No  Homicidal Thoughts:  No  Memory:  Immediate;   Fair Recent;   Poor Remote;   Poor  Judgement:  Fair  Insight:  Shallow  Psychomotor Activity:    Concentration:  Concentration: Fair and Attention Span: Poor  Recall:  Blake Richards of Knowledge:  Fair  Language:  Fair  Akathisia:  No  Handed:  Right  AIMS (if indicated):     Assets:  NADesire for Improvement Housing Social Support  ADL's:  Impaired  Cognition:  Impaired,  Mild  Sleep:   ok      Assessment and Plan: Hallucinations.  Generalized anxiety disorder.  Autism spectrum disorder.  Patient is stable on Zoloft 100 mg daily and Xanax 0.25 mg daily.  His symptoms are stable.  Discussed medication side effects and benefits.  Recommended to call us back if any question or worsening of symptoms.  Follow up in 6 months.  Follow Up Instructions:    I discussed the assessment and treatment plan with the  patient. The patient was provided an opportunity to ask questions and all were answered. The patient agreed with the plan and demonstrated an understanding of the instructions.   The patient was advised to call back or seek an in-person evaluation if the symptoms worsen or if the condition fails to improve as anticipated.  Collaboration of Care: Other provider involved in patient's care AEB notes are available in epic to review.  Patient/Guardian was advised Release of Information must be obtained prior to any record release in order to collaborate their care with an outside provider. Patient/Guardian was advised if they have not already done so to contact the registration  department to sign all necessary forms in order for [place] to release information regarding their care.   Consent: Patient/Guardian gives verbal consent for treatment and assignment of benefits for services provided during this visit. Patient/Guardian expressed understanding and agreed to proceed.    I provided 20 minutes of non-face-to-face time during this encounter.   Blake Nations, MD

## 2023-02-06 ENCOUNTER — Other Ambulatory Visit (HOSPITAL_COMMUNITY): Payer: Self-pay | Admitting: Psychiatry

## 2023-02-06 DIAGNOSIS — F411 Generalized anxiety disorder: Secondary | ICD-10-CM

## 2023-02-08 ENCOUNTER — Encounter (HOSPITAL_COMMUNITY): Payer: Self-pay | Admitting: Psychiatry

## 2023-02-08 ENCOUNTER — Telehealth (HOSPITAL_BASED_OUTPATIENT_CLINIC_OR_DEPARTMENT_OTHER): Payer: Medicare HMO | Admitting: Psychiatry

## 2023-02-08 VITALS — Wt 200.0 lb

## 2023-02-08 DIAGNOSIS — R443 Hallucinations, unspecified: Secondary | ICD-10-CM

## 2023-02-08 DIAGNOSIS — F411 Generalized anxiety disorder: Secondary | ICD-10-CM | POA: Diagnosis not present

## 2023-02-08 DIAGNOSIS — F840 Autistic disorder: Secondary | ICD-10-CM | POA: Diagnosis not present

## 2023-02-08 MED ORDER — ALPRAZOLAM 0.25 MG PO TABS: ORAL_TABLET | ORAL | 5 refills | Status: AC

## 2023-02-08 MED ORDER — SERTRALINE HCL 100 MG PO TABS: 100.0000 mg | ORAL_TABLET | Freq: Every day | ORAL | 5 refills | Status: AC

## 2023-02-08 NOTE — Progress Notes (Signed)
[facility] MD Virtual Progress Note   Patient Location: Group Home Provider Location: Office  I connect with patient by telephone and verified that I am speaking with correct person by using two identifiers. I discussed the limitations of evaluation and management by telemedicine and the availability of in person appointments. I also discussed with the patient that there may be a patient responsible charge related to this service. The patient expressed understanding and agreed to proceed.  Blake Richards [id number] 67 y.o.  02/08/2023 10:08 AM  History of Present Illness:  Patient is evaluated by phone session.  His caretaker Lenon Curt helped the conversation.  Patient is a poor historian and lives in a group home.  Patient has a fall in December and he had bruises in his legs and broke his glasses.  Patient do remember that incident but did not provide much detail.  As per staff still struggle and sometimes challenges in his personal hygiene.  However he is not aggressive, agitated, angry.  He does talk to himself and sometimes gets excited and loud.  He does require a lot of redirections for handwashing.  Staff noticed sometimes he gets fixated on 1 thing but not aggressive.  He sleeps okay and he is eating well.  He denies any side effects or medication.  He is scheduled to see his primary care Dr. Everlene Farrier for other health needs.  Patient was asked if he has any suicidal thoughts or hallucination but patient denies.  He reported his mood is fine.  Past Psychiatric History: As per legal guardian, no h/o inpatient treatment or violence. .  H/O anxiety and given Effexor from PCP. It is unclear why Effexor was discontinued.  He saw Chapman Moss at top priority and given Risperdal because of hallucination.      Outpatient Encounter Medications as of 02/08/2023  Medication Sig   acetaZOLAMIDE (DIAMOX) 250 MG tablet Take 250 mg by mouth 2 (two) times daily.   ALPRAZolam (XANAX) 0.25 MG  tablet TAKE (1) TABLET BY MOUTH ONCE DAILY.   amLODipine (NORVASC) 5 MG tablet Take by mouth.   carbamazepine (TEGRETOL XR) 400 MG 12 hr tablet Take 800 mg by mouth 2 (two) times daily.   cephALEXin (KEFLEX) 500 MG capsule Take 1 capsule (500 mg total) by mouth 4 (four) times daily.   cholecalciferol (VITAMIN D3) 25 MCG (1000 UT) tablet Take 1,000 Units by mouth daily.   fluticasone (FLONASE) 50 MCG/ACT nasal spray    levETIRAcetam (KEPPRA) 500 MG tablet Take 500-750 mg by mouth every 12 (twelve) hours. 750 morning and 500 mg in the evening   levothyroxine (SYNTHROID) 25 MCG tablet Take 25 mcg by mouth.   pravastatin (PRAVACHOL) 20 MG tablet Take by mouth.   risperiDONE (RISPERDAL) 0.25 MG tablet Take one tab as needed for anxiety and agitation (Patient not taking: Reported on 02/08/2022)   sertraline (ZOLOFT) 100 MG tablet Take 1 tablet (100 mg total) by mouth daily.   No facility-administered encounter medications on file as of 02/08/2023.    No results found for this or any previous visit (from the past 2160 hour(s)).   Psychiatric Specialty Exam: Physical Exam  Review of Systems  Weight 200 lb (90.7 kg).There is no height or weight on file to calculate BMI.  General Appearance: NA  Eye Contact:  NA  Speech:  Slow  Volume:  Decreased  Mood:   I am fine  Affect:  NA  Thought Process:  Descriptions of Associations: Intact  Orientation:  Full (Time, Place, and Person)  Thought Content:  Obsessions and talk to himself, at times loud  Suicidal Thoughts:  No  Homicidal Thoughts:  No  Memory:  Immediate;   Poor Recent;   Poor Remote;   Fair  Judgement:  Fair  Insight:  Shallow  Psychomotor Activity:  Decreased  Concentration:  Concentration: Poor and Attention Span: Poor  Recall:  Poor  Fund of Knowledge:  Fair  Language:  Fair  Akathisia:  No  Handed:  Right  AIMS (if indicated):     Assets:  Desire for Improvement Housing Social Support  ADL's:  Impaired  Cognition:   Impaired,  Mild  Sleep:  ok     Assessment/Plan: GAD (generalized anxiety disorder)  Autism  Hallucination  Review notes from other provider.  His mood is stable and his hallucinations are under control.  He has not given any respite all in a while.  Continue Zoloft 100 mg daily, Xanax 0.25 mg daily.  I recommend have next appointment in person in 6 months.  Recommended to call us back if is any question or any concern.  Follow-up in 6 months.   Follow Up Instructions:     I discussed the assessment and treatment plan with the patient. The patient was provided an opportunity to ask questions and all were answered. The patient agreed with the plan and demonstrated an understanding of the instructions.   The patient was advised to call back or seek an in-person evaluation if the symptoms worsen or if the condition fails to improve as anticipated.    Collaboration of Care: Other provider involved in patient's care AEB notes are available in epic to review.  Patient/Guardian was advised Release of Information must be obtained prior to any record release in order to collaborate their care with an outside provider. Patient/Guardian was advised if they have not already done so to contact the registration department to sign all necessary forms in order for [place] to release information regarding their care.   Consent: Patient/Guardian gives verbal consent for treatment and assignment of benefits for services provided during this visit. Patient/Guardian expressed understanding and agreed to proceed.     I provided 28 minutes of non face to face time during this encounter.  [name], MD 02/08/2023

## 2023-07-12 ENCOUNTER — Ambulatory Visit (HOSPITAL_COMMUNITY): Payer: Medicare HMO | Admitting: Psychiatry

## 2023-08-09 ENCOUNTER — Ambulatory Visit (HOSPITAL_COMMUNITY): Payer: Medicare HMO | Admitting: Psychiatry

## 2023-08-25 ENCOUNTER — Emergency Department (HOSPITAL_BASED_OUTPATIENT_CLINIC_OR_DEPARTMENT_OTHER)
Admission: EM | Admit: 2023-08-25 | Discharge: 2023-08-26 | Disposition: A | Discharge status: Home / Self Care | Payer: Medicare HMO | Attending: Emergency Medicine | Admitting: Emergency Medicine

## 2023-08-25 ENCOUNTER — Emergency Department (HOSPITAL_BASED_OUTPATIENT_CLINIC_OR_DEPARTMENT_OTHER): Payer: Medicare HMO

## 2023-08-25 DIAGNOSIS — I10 Essential (primary) hypertension: Secondary | ICD-10-CM | POA: Diagnosis not present

## 2023-08-25 DIAGNOSIS — S59919A Unspecified injury of unspecified forearm, initial encounter: Secondary | ICD-10-CM | POA: Diagnosis present

## 2023-08-25 DIAGNOSIS — Z8659 Personal history of other mental and behavioral disorders: Secondary | ICD-10-CM | POA: Insufficient documentation

## 2023-08-25 DIAGNOSIS — W19XXXA Unspecified fall, initial encounter: Secondary | ICD-10-CM | POA: Diagnosis not present

## 2023-08-25 DIAGNOSIS — R569 Unspecified convulsions: Secondary | ICD-10-CM | POA: Diagnosis not present

## 2023-08-25 DIAGNOSIS — S50812A Abrasion of left forearm, initial encounter: Secondary | ICD-10-CM | POA: Insufficient documentation

## 2023-08-25 NOTE — ED Triage Notes (Signed)
Pt to exam 4 via EMS from [facility] home c/o neck pain resulting from unwitnessed GLF. Pt presents in C collar with NO obvious deformities or swelling noted. Staff reports PT has been less verbal since fall and that was why they called EMS.  Pt has HX of Epilepsy and Autism with baseline a/o x 3  VSS NAD PT on room air.

## 2023-08-25 NOTE — Discharge Instructions (Addendum)
Follow-up with his neurologist.  Keppra and Tegretol levels have been ordered but have not resulted.

## 2023-08-25 NOTE — ED Provider Notes (Signed)
[place] EMERGENCY DEPARTMENT AT [facility] Provider Note   CSN: [id number] Arrival date & time: 08/25/23  2108     History {Add pertinent medical, surgical, social history, OB history to HPI:1} Chief Complaint  Patient presents with   Blake Richards is a 67 y.o. male.   Fall  Patient presents after ground-level fall.  Presents with caregiver.  Has autism and epilepsy.  Has had previous seizures.  Did not witness fall but was confused and wanted to sleep after like when he has had previous seizures.  Is on Keppra and Tegretol.  Has been compliant with doses.  Mental status is back now more towards baseline.  Reportedly hit his head.    Past Medical History:  Diagnosis Date   Autism    Epilepsy (HCC)    Hypertension    Thyroid disease     Home Medications Prior to Admission medications   Medication Sig Start Date End Date Taking? Authorizing Provider  acetaZOLAMIDE (DIAMOX) 250 MG tablet Take 250 mg by mouth 2 (two) times daily.    [provider]  ALPRAZolam (XANAX) 0.25 MG tablet TAKE (1) TABLET BY MOUTH ONCE DAILY. 02/08/23   Arfeen, Phillips Grout, MD  amLODipine (NORVASC) 5 MG tablet Take by mouth. 06/27/22   [provider]  carbamazepine (TEGRETOL XR) 400 MG 12 hr tablet Take 800 mg by mouth 2 (two) times daily.    [provider]  cephALEXin (KEFLEX) 500 MG capsule Take 1 capsule (500 mg total) by mouth 4 (four) times daily. 11/04/20   Wieters, Hallie C, PA-C  cholecalciferol (VITAMIN D3) 25 MCG (1000 UT) tablet Take 1,000 Units by mouth daily.    [provider]  fluticasone Aleda Grana) 50 MCG/ACT nasal spray  04/01/19   [provider]  levETIRAcetam (KEPPRA) 500 MG tablet Take 500-750 mg by mouth every 12 (twelve) hours. 750 morning and 500 mg in the evening    [provider]  levothyroxine (SYNTHROID) 25 MCG tablet Take 25 mcg by mouth. 10/22/20   Macy Mis, MD  pravastatin (PRAVACHOL) 20 MG tablet  Take by mouth. 04/15/19   [provider]  risperiDONE (RISPERDAL) 0.25 MG tablet Take one tab as needed for anxiety and agitation Patient not taking: Reported on 02/08/2022 08/16/21   Arfeen, Phillips Grout, MD  sertraline (ZOLOFT) 100 MG tablet Take 1 tablet (100 mg total) by mouth daily. 02/08/23   Arfeen, Phillips Grout, MD      Allergies    Tropicamide and Lactose intolerance (gi)    Review of Systems   Review of Systems  Physical Exam Updated Vital Signs BP (!) 151/86   Pulse (!) 59   Temp 98.3 F (36.8 C) (Oral)   Resp 15   Wt 90.7 kg   SpO2 97%   BMI 27.12 kg/m  Physical Exam Vitals and nursing note reviewed.  HENT:     Head: Normocephalic.  Eyes:     Pupils: Pupils are equal, round, and reactive to light.  Cardiovascular:     Rate and Rhythm: Regular rhythm.  Chest:     Chest wall: No tenderness.  Abdominal:     Tenderness: There is no abdominal tenderness.  Musculoskeletal:     Cervical back: No tenderness.     Comments: Mild abrasion to left forearm.  No underlying bony tenderness.  Neurological:     Mental Status: He is alert. Mental status is at baseline.     ED Results /  Procedures / Treatments   Labs (all labs ordered are listed, but only abnormal results are displayed) Labs Reviewed  CARBAMAZEPINE LEVEL, TOTAL  COMPREHENSIVE METABOLIC PANEL  CBC  LEVETIRACETAM LEVEL    EKG EKG Interpretation Date/Time:  Saturday August 25 2023 21:31:53 EDT Ventricular Rate:  59 PR Interval:  220 QRS Duration:  102 QT Interval:  421 QTC Calculation: 417 R Axis:   65  Text Interpretation: duplicate Confirmed by Benjiman Core [phone]) on 08/25/2023 11:54:57 PM  Radiology No results found.  Procedures Procedures  {Document cardiac monitor, telemetry assessment procedure when appropriate:1}  Medications Ordered in ED Medications - No data to display  ED Course/ Medical Decision Making/ A&P   {   Click here for ABCD2, HEART and other calculatorsREFRESH  Note before signing :1}                              Medical Decision Making Amount and/or Complexity of Data Reviewed Labs: ordered. Radiology: ordered.   Patient with fall.  Potentially seizure.  Does have a history of seizure.  Now at his baseline.  History comes from caregiver.  With potential head injury will get head CT and cervical spine CT.  Is on Keppra and Tegretol and will get levels. EKG reassuring.  Likely discharge home.    {Document critical care time when appropriate:1} {Document review of labs and clinical decision tools ie heart score, Chads2Vasc2 etc:1}  {Document your independent review of radiology images, and any outside records:1} {Document your discussion with family members, caretakers, and with consultants:1} {Document social determinants of health affecting pt's care:1} {Document your decision making why or why not admission, treatments were needed:1} Final Clinical Impression(s) / ED Diagnoses Final diagnoses:  Fall, initial encounter  Seizure [facility])    Rx / DC Orders ED Discharge Orders     None

## 2023-08-26 DIAGNOSIS — S50812A Abrasion of left forearm, initial encounter: Secondary | ICD-10-CM | POA: Diagnosis not present

## 2023-08-26 LAB — COMPREHENSIVE METABOLIC PANEL
ALT: 23 U/L (ref 0–44)
AST: 19 U/L (ref 15–41)
Albumin: 3.9 g/dL (ref 3.5–5.0)
Alkaline Phosphatase: 105 U/L (ref 38–126)
Anion gap: 8 (ref 5–15)
BUN: 15 mg/dL (ref 8–23)
CO2: 25 mmol/L (ref 22–32)
Calcium: 8.7 mg/dL — ABNORMAL LOW (ref 8.9–10.3)
Chloride: 107 mmol/L (ref 98–111)
Creatinine, Ser: 1.06 mg/dL (ref 0.61–1.24)
GFR, Estimated: >60 mL/min (ref >60)
Glucose, Bld: 103 mg/dL — ABNORMAL HIGH (ref 70–99)
Potassium: 3.4 mmol/L — ABNORMAL LOW (ref 3.5–5.1)
Sodium: 140 mmol/L (ref 135–145)
Total Bilirubin: 0.3 mg/dL (ref 0.3–1.2)
Total Protein: 6.6 g/dL (ref 6.5–8.1)

## 2023-08-26 LAB — CBC
HCT: 40.1 % (ref 39.0–52.0)
Hemoglobin: 13.3 g/dL (ref 13.0–17.0)
MCH: 30.9 pg (ref 26.0–34.0)
MCHC: 33.2 g/dL (ref 30.0–36.0)
MCV: 93.3 fL (ref 80.0–100.0)
Platelets: 270 10*3/uL (ref 150–400)
RBC: 4.3 MIL/uL (ref 4.22–5.81)
RDW: 13.9 % (ref 11.5–15.5)
WBC: 9.2 10*3/uL (ref 4.0–10.5)
nRBC: 0 % (ref 0.0–0.2)

## 2023-08-26 LAB — CARBAMAZEPINE LEVEL, TOTAL: Carbamazepine Lvl: 7.9 ug/mL (ref 4.0–12.0)

## 2023-08-26 NOTE — ED Provider Notes (Signed)
Care assumed from Dr. Rubin Richards.  Patient here with head and neck pain after a fall.  CT imaging is reassuring.  Plan is to discharge him home after Keppra and Tegretol levels are drawn.  Patient and family aware that results would not be available today.  They will follow-up with her PCP and neurologist   Blake Octave, MD 08/26/23 [phone]

## 2023-08-27 LAB — LEVETIRACETAM LEVEL: Levetiracetam Lvl: 18.8 ug/mL (ref 10.0–40.0)

## 2023-10-21 ENCOUNTER — Emergency Department (HOSPITAL_COMMUNITY): Payer: Medicare HMO

## 2023-10-21 ENCOUNTER — Encounter (HOSPITAL_COMMUNITY): Admission: EM | Disposition: A | Discharge status: Home / Self Care | Payer: Self-pay | Source: Home / Self Care

## 2023-10-21 ENCOUNTER — Inpatient Hospital Stay (HOSPITAL_BASED_OUTPATIENT_CLINIC_OR_DEPARTMENT_OTHER)
Admission: EM | Admit: 2023-10-21 | Discharge: 2023-10-25 | DRG: 351 | Disposition: A | Discharge status: Home / Self Care | Payer: Medicare HMO | Attending: Surgery | Admitting: Surgery

## 2023-10-21 ENCOUNTER — Emergency Department (HOSPITAL_BASED_OUTPATIENT_CLINIC_OR_DEPARTMENT_OTHER): Payer: Medicare HMO

## 2023-10-21 ENCOUNTER — Emergency Department (HOSPITAL_COMMUNITY): Payer: Medicare HMO | Admitting: Anesthesiology

## 2023-10-21 ENCOUNTER — Encounter (HOSPITAL_BASED_OUTPATIENT_CLINIC_OR_DEPARTMENT_OTHER): Payer: Self-pay | Admitting: Emergency Medicine

## 2023-10-21 ENCOUNTER — Other Ambulatory Visit: Payer: Self-pay

## 2023-10-21 DIAGNOSIS — E739 Lactose intolerance, unspecified: Secondary | ICD-10-CM | POA: Diagnosis not present

## 2023-10-21 DIAGNOSIS — K4090 Unilateral inguinal hernia, without obstruction or gangrene, not specified as recurrent: Secondary | ICD-10-CM

## 2023-10-21 DIAGNOSIS — K567 Ileus, unspecified: Secondary | ICD-10-CM | POA: Diagnosis not present

## 2023-10-21 DIAGNOSIS — I10 Essential (primary) hypertension: Secondary | ICD-10-CM | POA: Diagnosis not present

## 2023-10-21 DIAGNOSIS — E039 Hypothyroidism, unspecified: Secondary | ICD-10-CM | POA: Diagnosis not present

## 2023-10-21 DIAGNOSIS — Z9889 Other specified postprocedural states: Secondary | ICD-10-CM

## 2023-10-21 DIAGNOSIS — Z79899 Other long term (current) drug therapy: Secondary | ICD-10-CM

## 2023-10-21 DIAGNOSIS — E876 Hypokalemia: Secondary | ICD-10-CM | POA: Diagnosis not present

## 2023-10-21 DIAGNOSIS — K56609 Unspecified intestinal obstruction, unspecified as to partial versus complete obstruction: Principal | ICD-10-CM

## 2023-10-21 DIAGNOSIS — Z79890 Hormone replacement therapy (postmenopausal): Secondary | ICD-10-CM | POA: Diagnosis not present

## 2023-10-21 DIAGNOSIS — Z888 Allergy status to other drugs, medicaments and biological substances status: Secondary | ICD-10-CM

## 2023-10-21 DIAGNOSIS — F840 Autistic disorder: Secondary | ICD-10-CM | POA: Diagnosis present

## 2023-10-21 DIAGNOSIS — G40909 Epilepsy, unspecified, not intractable, without status epilepticus: Secondary | ICD-10-CM | POA: Diagnosis not present

## 2023-10-21 DIAGNOSIS — R112 Nausea with vomiting, unspecified: Secondary | ICD-10-CM | POA: Diagnosis present

## 2023-10-21 DIAGNOSIS — K4030 Unilateral inguinal hernia, with obstruction, without gangrene, not specified as recurrent: Principal | ICD-10-CM | POA: Diagnosis present

## 2023-10-21 HISTORY — PX: INGUINAL HERNIA REPAIR: SHX194

## 2023-10-21 LAB — CBC WITH DIFFERENTIAL/PLATELET
Abs Immature Granulocytes: 0.07 10*3/uL (ref 0.00–0.07)
Basophils Absolute: 0 10*3/uL (ref 0.0–0.1)
Basophils Relative: 0 %
Eosinophils Absolute: 0 10*3/uL (ref 0.0–0.5)
Eosinophils Relative: 0 %
HCT: 47.6 % (ref 39.0–52.0)
Hemoglobin: 15.8 g/dL (ref 13.0–17.0)
Immature Granulocytes: 1 %
Lymphocytes Relative: 6 %
Lymphs Abs: 0.8 10*3/uL (ref 0.7–4.0)
MCH: 31 pg (ref 26.0–34.0)
MCHC: 33.2 g/dL (ref 30.0–36.0)
MCV: 93.3 fL (ref 80.0–100.0)
Monocytes Absolute: 1.1 10*3/uL — ABNORMAL HIGH (ref 0.1–1.0)
Monocytes Relative: 9 %
Neutro Abs: 10.7 10*3/uL — ABNORMAL HIGH (ref 1.7–7.7)
Neutrophils Relative %: 84 %
Platelets: 287 10*3/uL (ref 150–400)
RBC: 5.1 MIL/uL (ref 4.22–5.81)
RDW: 13.6 % (ref 11.5–15.5)
WBC: 12.7 10*3/uL — ABNORMAL HIGH (ref 4.0–10.5)
nRBC: 0 % (ref 0.0–0.2)

## 2023-10-21 LAB — URINALYSIS, ROUTINE W REFLEX MICROSCOPIC
Bacteria, UA: NONE SEEN
Bilirubin Urine: NEGATIVE
Glucose, UA: NEGATIVE mg/dL
Ketones, ur: NEGATIVE mg/dL
Leukocytes,Ua: NEGATIVE
Nitrite: NEGATIVE
Protein, ur: 100 mg/dL — AB
Specific Gravity, Urine: 1.04 — ABNORMAL HIGH (ref 1.005–1.030)
pH: 6 (ref 5.0–8.0)

## 2023-10-21 LAB — COMPREHENSIVE METABOLIC PANEL
ALT: 64 U/L — ABNORMAL HIGH (ref 0–44)
AST: 57 U/L — ABNORMAL HIGH (ref 15–41)
Albumin: 4.8 g/dL (ref 3.5–5.0)
Alkaline Phosphatase: 104 U/L (ref 38–126)
Anion gap: 9 (ref 5–15)
BUN: 24 mg/dL — ABNORMAL HIGH (ref 8–23)
CO2: 27 mmol/L (ref 22–32)
Calcium: 9.6 mg/dL (ref 8.9–10.3)
Chloride: 106 mmol/L (ref 98–111)
Creatinine, Ser: 1.15 mg/dL (ref 0.61–1.24)
GFR, Estimated: >60 mL/min (ref >60)
Glucose, Bld: 116 mg/dL — ABNORMAL HIGH (ref 70–99)
Potassium: 3.3 mmol/L — ABNORMAL LOW (ref 3.5–5.1)
Sodium: 142 mmol/L (ref 135–145)
Total Bilirubin: 0.6 mg/dL (ref <1.2)
Total Protein: 8.1 g/dL (ref 6.5–8.1)

## 2023-10-21 LAB — I-STAT CG4 LACTIC ACID, ED: Lactic Acid, Venous: 1.2 mmol/L (ref 0.5–1.9)

## 2023-10-21 LAB — LIPASE, BLOOD: Lipase: 19 U/L (ref 11–51)

## 2023-10-21 SURGERY — REPAIR, HERNIA, INGUINAL, ADULT
Anesthesia: General | Laterality: Left

## 2023-10-21 MED ORDER — ROCURONIUM BROMIDE 10 MG/ML (PF) SYRINGE
PREFILLED_SYRINGE | INTRAVENOUS | Status: AC
  Filled 2023-10-21: qty 10

## 2023-10-21 MED ORDER — ACETAMINOPHEN 10 MG/ML IV SOLN
INTRAVENOUS | Status: AC
  Filled 2023-10-21: qty 100

## 2023-10-21 MED ORDER — DEXAMETHASONE SODIUM PHOSPHATE 10 MG/ML IJ SOLN
INTRAMUSCULAR | Status: DC | PRN
  Administered 2023-10-21: 4 mg via INTRAVENOUS

## 2023-10-21 MED ORDER — FENTANYL CITRATE (PF) 250 MCG/5ML IJ SOLN
INTRAMUSCULAR | Status: DC | PRN
  Administered 2023-10-21 (×3): 50 ug via INTRAVENOUS

## 2023-10-21 MED ORDER — LIDOCAINE 2% (20 MG/ML) 5 ML SYRINGE
INTRAMUSCULAR | Status: AC
  Filled 2023-10-21: qty 5

## 2023-10-21 MED ORDER — ALUM & MAG HYDROXIDE-SIMETH 200-200-20 MG/5ML PO SUSP
30.0000 mL | Freq: Once | ORAL | Status: AC
  Administered 2023-10-21: 30 mL via ORAL
  Filled 2023-10-21: qty 30

## 2023-10-21 MED ORDER — KETOROLAC TROMETHAMINE 15 MG/ML IJ SOLN
15.0000 mg | Freq: Four times a day (QID) | INTRAMUSCULAR | Status: AC
  Administered 2023-10-21 – 2023-10-22 (×4): 15 mg via INTRAVENOUS
  Filled 2023-10-21 (×4): qty 1

## 2023-10-21 MED ORDER — ONDANSETRON 4 MG PO TBDP: 4.0000 mg | ORAL_TABLET | Freq: Four times a day (QID) | ORAL | Status: DC | PRN

## 2023-10-21 MED ORDER — MORPHINE SULFATE (PF) 4 MG/ML IV SOLN
4.0000 mg | Freq: Once | INTRAVENOUS | Status: AC
  Administered 2023-10-21: 4 mg via INTRAVENOUS
  Filled 2023-10-21: qty 1

## 2023-10-21 MED ORDER — BUPIVACAINE HCL (PF) 0.25 % IJ SOLN
INTRAMUSCULAR | Status: AC
Start: 2023-10-21 — End: ?
  Filled 2023-10-21: qty 30

## 2023-10-21 MED ORDER — PROPOFOL 10 MG/ML IV BOLUS
INTRAVENOUS | Status: AC
  Filled 2023-10-21: qty 20

## 2023-10-21 MED ORDER — SUCCINYLCHOLINE CHLORIDE 200 MG/10ML IV SOSY
PREFILLED_SYRINGE | INTRAVENOUS | Status: DC | PRN
  Administered 2023-10-21: 120 mg via INTRAVENOUS

## 2023-10-21 MED ORDER — CEFAZOLIN SODIUM-DEXTROSE 2-3 GM-%(50ML) IV SOLR
INTRAVENOUS | Status: DC | PRN
  Administered 2023-10-21: 2 g via INTRAVENOUS

## 2023-10-21 MED ORDER — OXYCODONE HCL 5 MG PO TABS
5.0000 mg | ORAL_TABLET | ORAL | Status: DC | PRN
  Administered 2023-10-23 – 2023-10-25 (×2): 5 mg via ORAL
  Filled 2023-10-21 (×2): qty 1

## 2023-10-21 MED ORDER — SUCCINYLCHOLINE CHLORIDE 200 MG/10ML IV SOSY
PREFILLED_SYRINGE | INTRAVENOUS | Status: AC
  Filled 2023-10-21: qty 10

## 2023-10-21 MED ORDER — DOCUSATE SODIUM 100 MG PO CAPS
100.0000 mg | ORAL_CAPSULE | Freq: Two times a day (BID) | ORAL | Status: DC
  Administered 2023-10-21 – 2023-10-25 (×5): 100 mg via ORAL
  Filled 2023-10-21 (×5): qty 1

## 2023-10-21 MED ORDER — ONDANSETRON HCL 4 MG/2ML IJ SOLN
INTRAMUSCULAR | Status: AC
  Filled 2023-10-21: qty 2

## 2023-10-21 MED ORDER — ENOXAPARIN SODIUM 40 MG/0.4ML IJ SOSY
40.0000 mg | PREFILLED_SYRINGE | INTRAMUSCULAR | Status: DC
  Administered 2023-10-22 – 2023-10-25 (×4): 40 mg via SUBCUTANEOUS
  Filled 2023-10-21 (×4): qty 0.4

## 2023-10-21 MED ORDER — PHENYLEPHRINE 80 MCG/ML (10ML) SYRINGE FOR IV PUSH (FOR BLOOD PRESSURE SUPPORT)
PREFILLED_SYRINGE | INTRAVENOUS | Status: DC | PRN
  Administered 2023-10-21: 40 ug via INTRAVENOUS
  Administered 2023-10-21: 80 ug via INTRAVENOUS

## 2023-10-21 MED ORDER — BUPIVACAINE LIPOSOME 1.3 % IJ SUSP
INTRAMUSCULAR | Status: AC
  Filled 2023-10-21: qty 20

## 2023-10-21 MED ORDER — ACETAMINOPHEN 500 MG PO TABS
1000.0000 mg | ORAL_TABLET | Freq: Four times a day (QID) | ORAL | Status: DC
  Administered 2023-10-22 – 2023-10-25 (×12): 1000 mg via ORAL
  Filled 2023-10-21 (×13): qty 2

## 2023-10-21 MED ORDER — PHENYLEPHRINE HCL-NACL 20-0.9 MG/250ML-% IV SOLN
INTRAVENOUS | Status: DC | PRN
  Administered 2023-10-21: 50 ug/min via INTRAVENOUS

## 2023-10-21 MED ORDER — ONDANSETRON HCL 4 MG/2ML IJ SOLN
INTRAMUSCULAR | Status: DC | PRN
  Administered 2023-10-21: 4 mg via INTRAVENOUS

## 2023-10-21 MED ORDER — SUGAMMADEX SODIUM 200 MG/2ML IV SOLN
INTRAVENOUS | Status: DC | PRN
  Administered 2023-10-21: 200 mg via INTRAVENOUS

## 2023-10-21 MED ORDER — SODIUM CHLORIDE 0.9 % IV SOLN: INTRAVENOUS | Status: DC | PRN

## 2023-10-21 MED ORDER — ONDANSETRON HCL 4 MG/2ML IJ SOLN
4.0000 mg | Freq: Four times a day (QID) | INTRAMUSCULAR | Status: DC | PRN
  Administered 2023-10-23 (×2): 4 mg via INTRAVENOUS
  Filled 2023-10-21 (×2): qty 2

## 2023-10-21 MED ORDER — ONDANSETRON HCL 4 MG/2ML IJ SOLN
4.0000 mg | Freq: Once | INTRAMUSCULAR | Status: AC
  Administered 2023-10-21: 4 mg via INTRAVENOUS
  Filled 2023-10-21: qty 2

## 2023-10-21 MED ORDER — MIDAZOLAM HCL 2 MG/2ML IJ SOLN
INTRAMUSCULAR | Status: AC
  Filled 2023-10-21: qty 2

## 2023-10-21 MED ORDER — AMISULPRIDE (ANTIEMETIC) 5 MG/2ML IV SOLN: 10.0000 mg | Freq: Once | INTRAVENOUS | Status: DC | PRN

## 2023-10-21 MED ORDER — FENTANYL CITRATE (PF) 100 MCG/2ML IJ SOLN: 25.0000 ug | INTRAMUSCULAR | Status: DC | PRN

## 2023-10-21 MED ORDER — LIDOCAINE 2% (20 MG/ML) 5 ML SYRINGE
INTRAMUSCULAR | Status: DC | PRN
  Administered 2023-10-21: 60 mg via INTRAVENOUS

## 2023-10-21 MED ORDER — ROCURONIUM BROMIDE 10 MG/ML (PF) SYRINGE
PREFILLED_SYRINGE | INTRAVENOUS | Status: DC | PRN
  Administered 2023-10-21: 40 mg via INTRAVENOUS

## 2023-10-21 MED ORDER — FENTANYL CITRATE PF 50 MCG/ML IJ SOSY
75.0000 ug | PREFILLED_SYRINGE | Freq: Once | INTRAMUSCULAR | Status: AC
  Administered 2023-10-21: 75 ug via INTRAVENOUS
  Filled 2023-10-21: qty 2

## 2023-10-21 MED ORDER — DEXAMETHASONE SODIUM PHOSPHATE 10 MG/ML IJ SOLN
INTRAMUSCULAR | Status: AC
  Filled 2023-10-21: qty 1

## 2023-10-21 MED ORDER — MORPHINE SULFATE (PF) 2 MG/ML IV SOLN: 2.0000 mg | INTRAVENOUS | Status: DC | PRN

## 2023-10-21 MED ORDER — ACETAMINOPHEN 10 MG/ML IV SOLN
INTRAVENOUS | Status: DC | PRN
  Administered 2023-10-21: 1000 mg via INTRAVENOUS

## 2023-10-21 MED ORDER — IOHEXOL 300 MG/ML  SOLN
100.0000 mL | Freq: Once | INTRAMUSCULAR | Status: AC | PRN
  Administered 2023-10-21: 100 mL via INTRAVENOUS

## 2023-10-21 MED ORDER — METHOCARBAMOL 500 MG PO TABS
500.0000 mg | ORAL_TABLET | Freq: Four times a day (QID) | ORAL | Status: DC
  Administered 2023-10-21 – 2023-10-25 (×13): 500 mg via ORAL
  Filled 2023-10-21 (×13): qty 1

## 2023-10-21 MED ORDER — PROPOFOL 10 MG/ML IV BOLUS
INTRAVENOUS | Status: DC | PRN
  Administered 2023-10-21: 50 mg via INTRAVENOUS
  Administered 2023-10-21: 150 mg via INTRAVENOUS

## 2023-10-21 MED ORDER — LACTATED RINGERS IV SOLN: INTRAVENOUS | Status: AC

## 2023-10-21 MED ORDER — POTASSIUM CHLORIDE 10 MEQ/100ML IV SOLN
10.0000 meq | INTRAVENOUS | Status: AC
  Administered 2023-10-21 – 2023-10-22 (×4): 10 meq via INTRAVENOUS
  Filled 2023-10-21 (×4): qty 100

## 2023-10-21 MED ORDER — FENTANYL CITRATE (PF) 250 MCG/5ML IJ SOLN
INTRAMUSCULAR | Status: AC
  Filled 2023-10-21: qty 5

## 2023-10-21 MED ORDER — HALOPERIDOL LACTATE 5 MG/ML IJ SOLN
2.0000 mg | Freq: Once | INTRAMUSCULAR | Status: DC | PRN
  Filled 2023-10-21: qty 1

## 2023-10-21 MED ORDER — ALBUMIN HUMAN 5 % IV SOLN: INTRAVENOUS | Status: DC | PRN

## 2023-10-21 SURGICAL SUPPLY — 40 items
BAG COUNTER SPONGE SURGICOUNT (BAG) ×1 IMPLANT
BLADE CLIPPER SURG (BLADE) IMPLANT
CHLORAPREP W/TINT 26 (MISCELLANEOUS) ×1 IMPLANT
COVER SURGICAL LIGHT HANDLE (MISCELLANEOUS) ×1 IMPLANT
DERMABOND ADVANCED .7 DNX12 (GAUZE/BANDAGES/DRESSINGS) ×1 IMPLANT
DRAIN PENROSE .5X12 LATEX STL (DRAIN) ×1 IMPLANT
DRAIN PENROSE 0.5X18 (DRAIN) IMPLANT
DRAPE LAPAROSCOPIC ABDOMINAL (DRAPES) IMPLANT
DRAPE LAPAROTOMY TRNSV 102X78 (DRAPES) IMPLANT
DRSG TEGADERM 4X4.75 (GAUZE/BANDAGES/DRESSINGS) IMPLANT
ELECT CAUTERY BLADE 6.4 (BLADE) IMPLANT
ELECT REM PT RETURN 9FT ADLT (ELECTROSURGICAL) ×1
ELECTRODE REM PT RTRN 9FT ADLT (ELECTROSURGICAL) ×1 IMPLANT
GAUZE 4X4 16PLY ~~LOC~~+RFID DBL (SPONGE) ×1 IMPLANT
GAUZE SPONGE 4X4 12PLY STRL (GAUZE/BANDAGES/DRESSINGS) IMPLANT
GLOVE BIO SURGEON STRL SZ 6.5 (GLOVE) ×1 IMPLANT
GLOVE BIOGEL PI IND STRL 6 (GLOVE) ×1 IMPLANT
GOWN STRL REUS W/ TWL LRG LVL3 (GOWN DISPOSABLE) ×3 IMPLANT
KIT BASIN OR (CUSTOM PROCEDURE TRAY) ×1 IMPLANT
KIT TURNOVER KIT B (KITS) ×1 IMPLANT
MESH ULTRAPRO 3X6 7.6X15CM (Mesh General) IMPLANT
NDL HYPO 25GX1X1/2 BEV (NEEDLE) ×1 IMPLANT
NEEDLE HYPO 25GX1X1/2 BEV (NEEDLE) ×1 IMPLANT
NS IRRIG 1000ML POUR BTL (IV SOLUTION) ×1 IMPLANT
PACK GENERAL/GYN (CUSTOM PROCEDURE TRAY) ×1 IMPLANT
PAD ARMBOARD 7.5X6 YLW CONV (MISCELLANEOUS) ×1 IMPLANT
SPONGE INTESTINAL PEANUT (DISPOSABLE) IMPLANT
SUT MNCRL AB 4-0 PS2 18 (SUTURE) ×1 IMPLANT
SUT NOVA NAB GS-21 0 18 T12 DT (SUTURE) IMPLANT
SUT PDS AB 0 CT 36 (SUTURE) IMPLANT
SUT SILK 0 SH 30 (SUTURE) IMPLANT
SUT SILK 2 0 SH (SUTURE) IMPLANT
SUT SILK 3-0 18XBRD TIE 12 (SUTURE) IMPLANT
SUT VIC AB 0 CT2 27 (SUTURE) ×1 IMPLANT
SUT VIC AB 2-0 SH 27X BRD (SUTURE) ×1 IMPLANT
SUT VIC AB 2-0 SH 27XBRD (SUTURE) IMPLANT
SUT VIC AB 3-0 SH 27XBRD (SUTURE) ×1 IMPLANT
SYR CONTROL 10ML LL (SYRINGE) ×1 IMPLANT
TOWEL GREEN STERILE (TOWEL DISPOSABLE) ×1 IMPLANT
TOWEL GREEN STERILE FF (TOWEL DISPOSABLE) ×1 IMPLANT

## 2023-10-21 NOTE — Plan of Care (Signed)

## 2023-10-21 NOTE — Discharge Instructions (Addendum)
[facility]  UMBILICAL OR INGUINAL HERNIA REPAIR: POST OP INSTRUCTIONS  Always review your discharge instruction sheet given to you by the facility where your surgery was performed. IF YOU HAVE DISABILITY OR FAMILY LEAVE FORMS, YOU MUST BRING THEM TO THE OFFICE FOR PROCESSING.   DO NOT GIVE THEM TO YOUR DOCTOR.  A  prescription for pain medication may be given to you upon discharge.  Take your pain medication as prescribed, if needed.  If narcotic pain medicine is not needed, then you may take acetaminophen (Tylenol), naprosyn (Alleve) or ibuprofen (Advil) as needed. Take your usually prescribed medications unless otherwise directed. If you need a refill on your pain medication, please contact your pharmacy.  They will contact our office to request authorization. Prescriptions will not be filled after 5 pm or on week-ends. You should follow a light diet the first 24 hours after arrival home, such as soup and crackers, etc.  Be sure to include lots of fluids daily.  Resume your normal diet the day after surgery. Most patients will experience some swelling and bruising around the umbilicus or in the groin and scrotum.  Ice packs and reclining will help.  Swelling and bruising can take several days to resolve.  It is common to experience some constipation if taking pain medication after surgery.  Increasing fluid intake and taking a stool softener (such as Colace) will usually help or prevent this problem from occurring.  A mild laxative (Milk of Magnesia or Miralax) should be taken according to package directions if there are no bowel movements after 48 hours. Unless discharge instructions indicate otherwise, you may remove your bandages 48 hours after surgery, and you may shower at that time.  You may have steri-strips (small skin tapes) in place directly over the incision.  These strips should be left on the skin for 7-10 days and will come off on their own.  If your surgeon used  skin glue on the incision, you may shower in 24 hours.  The glue will flake off over the next 2-3 weeks.  Any sutures or staples will be removed at the office during your follow-up visit. ACTIVITIES:  You may resume regular (light) daily activities beginning the next day--such as daily self-care, walking, climbing stairs--gradually increasing activities as tolerated.  You may have sexual intercourse when it is comfortable.  Refrain from any heavy lifting or straining until approved by your doctor. You may drive when you are no longer taking prescription pain medication, you can comfortably wear a seatbelt, and you can safely maneuver your car and apply brakes. RETURN TO WORK:  __________________________________________________________ You should see your doctor in the office for a follow-up appointment approximately 2-3 weeks after your surgery.  Make sure that you call for this appointment within a day or two after you arrive home to insure a convenient appointment time. OTHER INSTRUCTIONS:  __________________________________________________________________________________________________________________________________________________________________________________________  WHEN TO CALL YOUR DOCTOR: Fever over 101.0 Inability to urinate Nausea and/or vomiting Extreme swelling or bruising Continued bleeding from incision. Increased pain, redness, or drainage from the incision  The clinic staff is available to answer your questions during regular business hours.  Please don't hesitate to call and ask to speak to one of the nurses for clinical concerns.  If you have a medical emergency, go to the nearest emergency room or call 911.  A surgeon from [facility] is always on call at the hospital   [address], [address], [place], [place]    [address] ?  [address] [address], [place],    [address] [phone] ? [phone] ? FAX [phone] Web site:  [email or web address]  

## 2023-10-21 NOTE — ED Provider Notes (Signed)
[place] EMERGENCY DEPARTMENT AT [facility] Provider Note   CSN: [id number] Arrival date & time: 10/21/23  5621     History {Add pertinent medical, surgical, social history, OB history to HPI:1} No chief complaint on file.   Blake Richards is a 67 y.o. male.  67 year old male who has a past medical history of autism spectrum disorder.  Currently residing in a group home.  Presenting here as a transfer for incarcerated left inguinal hernia with subsequent bowel obstruction.  Initially presented to freestanding emergency department for evaluation of nausea and vomiting that began yesterday.  Patient has not been tolerating oral intake since then.  On arrival here, patient complaining of pain in his left lower quadrant.  The history is provided by the patient, the EMS personnel, a caregiver and medical records.       Home Medications Prior to Admission medications   Medication Sig Start Date End Date Taking? Authorizing Provider  acetaZOLAMIDE (DIAMOX) 250 MG tablet Take 250 mg by mouth 2 (two) times daily.    [provider]  ALPRAZolam (XANAX) 0.25 MG tablet TAKE (1) TABLET BY MOUTH ONCE DAILY. 02/08/23   Arfeen, Phillips Grout, MD  amLODipine (NORVASC) 5 MG tablet Take by mouth. 06/27/22   [provider]  carbamazepine (TEGRETOL XR) 400 MG 12 hr tablet Take 800 mg by mouth 2 (two) times daily.    [provider]  cephALEXin (KEFLEX) 500 MG capsule Take 1 capsule (500 mg total) by mouth 4 (four) times daily. 11/04/20   Wieters, Hallie C, PA-C  cholecalciferol (VITAMIN D3) 25 MCG (1000 UT) tablet Take 1,000 Units by mouth daily.    [provider]  fluticasone Aleda Grana) 50 MCG/ACT nasal spray  04/01/19   [provider]  levETIRAcetam (KEPPRA) 500 MG tablet Take 500-750 mg by mouth every 12 (twelve) hours. 750 morning and 500 mg in the evening    [provider]  levothyroxine (SYNTHROID) 25 MCG tablet Take 25 mcg by mouth. 10/22/20    Macy Mis, MD  pravastatin (PRAVACHOL) 20 MG tablet Take by mouth. 04/15/19   [provider]  risperiDONE (RISPERDAL) 0.25 MG tablet Take one tab as needed for anxiety and agitation Patient not taking: Reported on 02/08/2022 08/16/21   Arfeen, Phillips Grout, MD  sertraline (ZOLOFT) 100 MG tablet Take 1 tablet (100 mg total) by mouth daily. 02/08/23   Arfeen, Phillips Grout, MD      Allergies    Tropicamide and Lactose intolerance (gi)    Review of Systems   As noted in HPI  Physical Exam Updated Vital Signs BP (!) 160/94   Pulse 73   Temp 98.6 F (37 C) (Oral)   Resp 17   Wt 88.5 kg   SpO2 99%   BMI 26.45 kg/m  Physical Exam Vitals reviewed.  Constitutional:      General: He is not in acute distress.    Appearance: Normal appearance. He is not ill-appearing, toxic-appearing or diaphoretic.  Cardiovascular:     Rate and Rhythm: Normal rate and regular rhythm.     Pulses:          Radial pulses are 2+ on the right side and 2+ on the left side.     Heart sounds: Normal heart sounds. No murmur heard.    No friction rub. No gallop.  Pulmonary:     Effort: Pulmonary effort is normal. No respiratory distress.     Breath sounds: Normal breath sounds. No wheezing,  rhonchi or rales.  Abdominal:     General: There is no distension.     Palpations: Abdomen is soft.     Tenderness: There is no abdominal tenderness. There is no guarding or rebound.     Hernia: A hernia (Approximately baseball sized left inguinal hernia that is incarcerated) is present.  Skin:    General: Skin is warm and dry.     Coloration: Skin is not jaundiced or pale.  Neurological:     Mental Status: He is alert.     ED Results / Procedures / Treatments   Labs (all labs ordered are listed, but only abnormal results are displayed) Labs Reviewed  CBC WITH DIFFERENTIAL/PLATELET - Abnormal; Notable for the following components:      Result Value   WBC 12.7 (*)    Neutro Abs 10.7 (*)    Monocytes Absolute 1.1  (*)    All other components within normal limits  COMPREHENSIVE METABOLIC PANEL - Abnormal; Notable for the following components:   Potassium 3.3 (*)    Glucose, Bld 116 (*)    BUN 24 (*)    AST 57 (*)    ALT 64 (*)    All other components within normal limits  URINALYSIS, ROUTINE W REFLEX MICROSCOPIC - Abnormal; Notable for the following components:   Specific Gravity, Urine 1.040 (*)    Hgb urine dipstick TRACE (*)    Protein, ur 100 (*)    All other components within normal limits  LIPASE, BLOOD    EKG None  Radiology CT ABDOMEN PELVIS W CONTRAST Result Date: 10/21/2023 CLINICAL DATA:  Nausea and vomiting for 2 days. EXAM: CT ABDOMEN AND PELVIS WITH CONTRAST TECHNIQUE: Multidetector CT imaging of the abdomen and pelvis was performed using the standard protocol following bolus administration of intravenous contrast. RADIATION DOSE REDUCTION: This exam was performed according to the departmental dose-optimization program which includes automated exposure control, adjustment of the mA and/or kV according to patient size and/or use of iterative reconstruction technique. CONTRAST:  OMNIPAQUE IOHEXOL 300 MG/ML  SOLN COMPARISON:  None Available. FINDINGS: Lower chest: The lung bases are clear of acute process. No pleural effusion or pulmonary lesions. The heart is normal in size. No pericardial effusion. The distal esophagus and aorta are unremarkable. Hepatobiliary: No focal liver abnormality is seen. No gallstones, gallbladder wall thickening, or biliary dilatation. Pancreas: Unremarkable. No pancreatic ductal dilatation or surrounding inflammatory changes. Spleen: Normal in size without focal abnormality. Adrenals/Urinary Tract: The adrenal glands are normal. 10 cm left renal cyst not requiring any further imaging evaluation or follow-up. The right kidney is unremarkable. No renal or obstructing ureteral calculi. No bladder calculi. No bladder lesions. Stomach/Bowel: The stomach is  distended with fluid. There is also fluid throughout the duodenum and there is markedly distended fluid-filled small bowel with air-fluid levels consistent with small-bowel obstruction. This is due to an incarcerated left inguinal hernia. No pneumatosis. The colon is unremarkable. The appendix is normal. Vascular/Lymphatic: The aorta is normal in caliber. No dissection. The branch vessels are patent. The major venous structures are patent. No mesenteric or retroperitoneal mass or adenopathy. Small scattered lymph nodes are noted. Reproductive: The prostate gland and seminal vesicles are unremarkable. Other: No pelvic mass or adenopathy. No free pelvic fluid collections. No inguinal mass or adenopathy. No abdominal wall hernia or subcutaneous lesions. Musculoskeletal: Age advanced degenerative lumbar spondylosis but no acute bony findings or bone lesions. IMPRESSION: 1. High-grade small-bowel obstruction due to a large incarcerated left inguinal  hernia. No pneumatosis. 2. No renal or obstructing ureteral calculi. 3. 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 Electronically Signed   By: Rudie Meyer M.D.   On: 10/21/2023 13:56    Procedures Procedures  {Document cardiac monitor, telemetry assessment procedure when appropriate:1}  Medications Ordered in ED Medications  alum & mag hydroxide-simeth (MAALOX/MYLANTA) 200-200-20 MG/5ML suspension 30 mL (30 mLs Oral Given 10/21/23 1156)  ondansetron (ZOFRAN) injection 4 mg (4 mg Intravenous Given 10/21/23 1156)  iohexol (OMNIPAQUE) 300 MG/ML solution 100 mL (100 mLs Intravenous Contrast Given 10/21/23 1333)  morphine (PF) 4 MG/ML injection 4 mg (4 mg Intravenous Given 10/21/23 1539)    ED Course/ Medical Decision Making/ A&P Clinical Course as of 10/21/23 1805  Sun Oct 21, 2023  1735 I spoke with Dr. Azucena Cecil, on-call surgeon for general surgery.  She recommends attempting repeat reduction here.  If that is unsuccessful, he may require surgical  intervention.  Plan to place ice packs to the patient's inguinal region in place and reversed Trendelenburg in anticipation of attempt for reduction of hernia. [JR]    Clinical Course User Index [JR] Rolla Flatten, MD   {   Click here for ABCD2, HEART and other calculatorsREFRESH Note before signing :1}                              Medical Decision Making Amount and/or Complexity of Data Reviewed Labs: ordered. Radiology: ordered.  Risk OTC drugs. Prescription drug management. Decision regarding hospitalization.   ***  {Document critical care time when appropriate:1} {Document review of labs and clinical decision tools ie heart score, Chads2Vasc2 etc:1}  {Document your independent review of radiology images, and any outside records:1} {Document your discussion with family members, caretakers, and with consultants:1} {Document social determinants of health affecting pt's care:1} {Document your decision making why or why not admission, treatments were needed:1} Final Clinical Impression(s) / ED Diagnoses Final diagnoses:  Small bowel obstruction (HCC)  Incarcerated inguinal hernia    Rx / DC Orders ED Discharge Orders     None

## 2023-10-21 NOTE — Op Note (Signed)
   Operative Note   Date: 10/21/2023  Procedure: left inguinal hernia repair with mesh  Pre-op diagnosis: incarcerated left inguinal hernia Post-op diagnosis: incarcerated indirect left inguinal hernia  Indication and clinical history: The patient is a 67 y.o. year old male with a left inguinal hernia.  Surgeon: Diamantina Monks, MD  Anesthesiologist: R. Sampson Goon, MD Anesthesia: General  Findings:  Specimen: none EBL: <5cc Drains/Implants: 3x6 ultrapro mesh  Disposition: PACU - hemodynamically stable.  Description of procedure: The patient was positioned supine on the operating room table. General anesthetic induction and intubation were uneventful. Foley catheter insertion was performed and was atraumatic. Time-out was performed verifying correct patient, procedure, laterality, and signature of informed consent. The left groin was prepped and draped in the usual sterile fashion.  An incision was made approximately 2/3 distance from the ASIS to the pubic tubercle and deepened through Scarpa's fascia until the external oblique aponeurosis was reached. The external oblique aponeurosis was entered sharply and opened using Metzenbaum scissors, so as to avoid injuring the ilioinguinal nerve. The spermatic cord was isolated and encircled with a Penrose drain. An indirect hernia was clearly identified and was reduced.  A 3x6 ultrapro mesh was cut to fit and sutured to the pubic tubercle with a zero vicryl suture and run along the shelving edge inferiorly and the conjoined tendon superiorly. A slit was made in the center of the mesh to accommodate the cord structures and a vicryl suture was used to create a snug fit around it. The ends of the mesh were overlapped beneath the external oblique aponeurosis. The external oblique was closed with a 2-0 vicryl suture and Scarpa's closed with a 3-0 vicryl. Local anesthetic was infiltrated into the surrounding tissues. The skin was closed with 4-0  monocryl suture and dermabond applied as dressing.   All sponge and instrument counts were correct at the conclusion of the procedure. The patient was awakened from anesthesia, extubated uneventfully, and transported to PACU - hemodynamically stable. There were no complications.    Diamantina Monks, MD General and Trauma Surgery [facility] Surgery

## 2023-10-21 NOTE — Transfer of Care (Signed)
Immediate Anesthesia Transfer of Care Note  Patient: Blake Richards  Procedure(s) Performed: OPEN LEFT HERNIA REPAIR INGUINAL (Left)  Patient Location: PACU  Anesthesia Type:General  Level of Consciousness: awake, alert , and oriented  Airway & Oxygen Therapy: Patient Spontanous Breathing and Patient connected to nasal cannula oxygen  Post-op Assessment: Report given to RN and Post -op Vital signs reviewed and stable  Post vital signs: Reviewed and stable  Last Vitals:  Vitals Value Taken Time  BP 125/76 10/21/23 2200  Temp 36.6 C 10/21/23 2130  Pulse 81 10/21/23 2201  Resp 15 10/21/23 2201  SpO2 94 % 10/21/23 2201  Vitals shown include unfiled device data.  Last Pain:  Vitals:   10/21/23 2200  TempSrc:   PainSc: Asleep         Complications: No notable events documented.

## 2023-10-21 NOTE — Anesthesia Preprocedure Evaluation (Addendum)
Anesthesia Evaluation  Patient identified by MRN, date of birth, ID band Patient awake    Reviewed: Allergy & Precautions, NPO status , Patient's Chart, lab work & pertinent test results  Airway Mallampati: II  TM Distance: >3 FB Neck ROM: Full    Dental  (+) Dental Advisory Given   Pulmonary neg pulmonary ROS   breath sounds clear to auscultation       Cardiovascular hypertension, Pt. on medications  Rhythm:Regular Rate:Normal     Neuro/Psych Seizures -, Well Controlled,  PSYCHIATRIC DISORDERS (Autism)         GI/Hepatic Neg liver ROS,,,Incarcerated inguinal hernia    Endo/Other  Hypothyroidism    Renal/GU negative Renal ROS     Musculoskeletal   Abdominal   Peds  Hematology negative hematology ROS (+)   Anesthesia Other Findings   Reproductive/Obstetrics                             Anesthesia Physical Anesthesia Plan  ASA: 3 and emergent  Anesthesia Plan: General   Post-op Pain Management: Ofirmev IV (intra-op)*   Induction: Intravenous and Rapid sequence  PONV Risk Score and Plan: 2 and Dexamethasone, Ondansetron and Treatment may vary due to age or medical condition  Airway Management Planned: Oral ETT  Additional Equipment:   Intra-op Plan:   Post-operative Plan: Extubation in OR  Informed Consent: I have reviewed the patients History and Physical, chart, labs and discussed the procedure including the risks, benefits and alternatives for the proposed anesthesia with the patient or authorized representative who has indicated his/her understanding and acceptance.     Dental advisory given  Plan Discussed with: CRNA  Anesthesia Plan Comments:        Anesthesia Quick Evaluation

## 2023-10-21 NOTE — ED Provider Notes (Signed)
[facility] Provider Note   CSN: [id number] Arrival date & time: 10/21/23  7846     History  No chief complaint on file.   Blake Richards is a 67 y.o. male with autism and lives at a group home presents with complaints of persistent vomiting and diarrhea.  He is accompanied by staff member today who states they noticed some diarrhea on Wednesday after his family had visited.  States it is typical for him to experience the symptoms after the visit because of the large quantities of food that he will consume during those occasions.  It is noted that on Friday he began to have some episodes of emesis.  Notes that he had 1 episode of emesis yesterday and another this morning.  No hematemesis, melena or hematochezia noted.  States he has not been complaining of abdominal pain.  No cough or URI symptoms.  Reports that another staff member is out with similar symptoms.   HPI  Past Medical History:  Diagnosis Date   Autism    Epilepsy (HCC)    Hypertension    Thyroid disease       Home Medications Prior to Admission medications   Medication Sig Start Date End Date Taking? Authorizing Provider  acetaZOLAMIDE (DIAMOX) 250 MG tablet Take 250 mg by mouth 2 (two) times daily.    [provider]  ALPRAZolam (XANAX) 0.25 MG tablet TAKE (1) TABLET BY MOUTH ONCE DAILY. 02/08/23   Arfeen, Phillips Grout, MD  amLODipine (NORVASC) 5 MG tablet Take by mouth. 06/27/22   [provider]  carbamazepine (TEGRETOL XR) 400 MG 12 hr tablet Take 800 mg by mouth 2 (two) times daily.    [provider]  cephALEXin (KEFLEX) 500 MG capsule Take 1 capsule (500 mg total) by mouth 4 (four) times daily. 11/04/20   Wieters, Hallie C, PA-C  cholecalciferol (VITAMIN D3) 25 MCG (1000 UT) tablet Take 1,000 Units by mouth daily.    [provider]  fluticasone Aleda Grana) 50 MCG/ACT nasal spray  04/01/19   [provider]  levETIRAcetam (KEPPRA) 500  MG tablet Take 500-750 mg by mouth every 12 (twelve) hours. 750 morning and 500 mg in the evening    [provider]  levothyroxine (SYNTHROID) 25 MCG tablet Take 25 mcg by mouth. 10/22/20   Macy Mis, MD  pravastatin (PRAVACHOL) 20 MG tablet Take by mouth. 04/15/19   [provider]  risperiDONE (RISPERDAL) 0.25 MG tablet Take one tab as needed for anxiety and agitation Patient not taking: Reported on 02/08/2022 08/16/21   Arfeen, Phillips Grout, MD  sertraline (ZOLOFT) 100 MG tablet Take 1 tablet (100 mg total) by mouth daily. 02/08/23   Arfeen, Phillips Grout, MD      Allergies    Tropicamide and Lactose intolerance (gi)    Review of Systems   Review of Systems  Gastrointestinal:  Positive for diarrhea, nausea and vomiting.    Physical Exam Updated Vital Signs BP (!) 172/106   Pulse 64   Temp 98.6 F (37 C) (Oral)   Resp 20   Wt 88.5 kg   SpO2 98%   BMI 26.45 kg/m  Physical Exam Vitals and nursing note reviewed.  Constitutional:      General: He is not in acute distress.    Appearance: He is well-developed.  HENT:     Head: Normocephalic and atraumatic.  Eyes:     Conjunctiva/sclera: Conjunctivae normal.  Cardiovascular:  Rate and Rhythm: Normal rate and regular rhythm.     Heart sounds: No murmur heard. Pulmonary:     Effort: Pulmonary effort is normal. No respiratory distress.     Breath sounds: Normal breath sounds.  Abdominal:     Palpations: Abdomen is soft.     Comments:  Mild epigastric tenderness, no rebound, guarding or peritoneal signs  Genitourinary:    Comments: Obvious incarcerated left-sided inguinal hernia without overlying skin changes, no erythema or increased warmth Musculoskeletal:        General: No swelling.     Cervical back: Neck supple.  Skin:    General: Skin is warm and dry.     Capillary Refill: Capillary refill takes less than 2 seconds.  Neurological:     Mental Status: He is alert.  Psychiatric:        Mood and Affect: Mood  normal.     ED Results / Procedures / Treatments   Labs (all labs ordered are listed, but only abnormal results are displayed) Labs Reviewed  CBC WITH DIFFERENTIAL/PLATELET - Abnormal; Notable for the following components:      Result Value   WBC 12.7 (*)    Neutro Abs 10.7 (*)    Monocytes Absolute 1.1 (*)    All other components within normal limits  COMPREHENSIVE METABOLIC PANEL - Abnormal; Notable for the following components:   Potassium 3.3 (*)    Glucose, Bld 116 (*)    BUN 24 (*)    AST 57 (*)    ALT 64 (*)    All other components within normal limits  URINALYSIS, ROUTINE W REFLEX MICROSCOPIC - Abnormal; Notable for the following components:   Specific Gravity, Urine 1.040 (*)    Hgb urine dipstick TRACE (*)    Protein, ur 100 (*)    All other components within normal limits  LIPASE, BLOOD    EKG None  Radiology CT ABDOMEN PELVIS W CONTRAST Result Date: 10/21/2023 CLINICAL DATA:  Nausea and vomiting for 2 days. EXAM: CT ABDOMEN AND PELVIS WITH CONTRAST TECHNIQUE: Multidetector CT imaging of the abdomen and pelvis was performed using the standard protocol following bolus administration of intravenous contrast. RADIATION DOSE REDUCTION: This exam was performed according to the departmental dose-optimization program which includes automated exposure control, adjustment of the mA and/or kV according to patient size and/or use of iterative reconstruction technique. CONTRAST:  OMNIPAQUE IOHEXOL 300 MG/ML  SOLN COMPARISON:  None Available. FINDINGS: Lower chest: The lung bases are clear of acute process. No pleural effusion or pulmonary lesions. The heart is normal in size. No pericardial effusion. The distal esophagus and aorta are unremarkable. Hepatobiliary: No focal liver abnormality is seen. No gallstones, gallbladder wall thickening, or biliary dilatation. Pancreas: Unremarkable. No pancreatic ductal dilatation or surrounding inflammatory changes. Spleen: Normal in  size without focal abnormality. Adrenals/Urinary Tract: The adrenal glands are normal. 10 cm left renal cyst not requiring any further imaging evaluation or follow-up. The right kidney is unremarkable. No renal or obstructing ureteral calculi. No bladder calculi. No bladder lesions. Stomach/Bowel: The stomach is distended with fluid. There is also fluid throughout the duodenum and there is markedly distended fluid-filled small bowel with air-fluid levels consistent with small-bowel obstruction. This is due to an incarcerated left inguinal hernia. No pneumatosis. The colon is unremarkable. The appendix is normal. Vascular/Lymphatic: The aorta is normal in caliber. No dissection. The branch vessels are patent. The major venous structures are patent. No mesenteric or retroperitoneal mass or adenopathy. Small  scattered lymph nodes are noted. Reproductive: The prostate gland and seminal vesicles are unremarkable. Other: No pelvic mass or adenopathy. No free pelvic fluid collections. No inguinal mass or adenopathy. No abdominal wall hernia or subcutaneous lesions. Musculoskeletal: Age advanced degenerative lumbar spondylosis but no acute bony findings or bone lesions. IMPRESSION: 1. High-grade small-bowel obstruction due to a large incarcerated left inguinal hernia. No pneumatosis. 2. No renal or obstructing ureteral calculi. 3. 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 Electronically Signed   By: Rudie Meyer M.D.   On: 10/21/2023 13:56    Procedures Procedures    Medications Ordered in ED Medications  alum & mag hydroxide-simeth (MAALOX/MYLANTA) 200-200-20 MG/5ML suspension 30 mL (30 mLs Oral Given 10/21/23 1156)  ondansetron (ZOFRAN) injection 4 mg (4 mg Intravenous Given 10/21/23 1156)  iohexol (OMNIPAQUE) 300 MG/ML solution 100 mL (100 mLs Intravenous Contrast Given 10/21/23 1333)  morphine (PF) 4 MG/ML injection 4 mg (4 mg Intravenous Given 10/21/23 1539)    ED Course/ Medical Decision Making/  A&P                                 Medical Decision Making  This patient presents to the ED with chief complaint(s) of nausea and vomiting.  The complaint involves an extensive differential diagnosis and also carries with it a high risk of complications and morbidity.   pertinent past medical history as listed in HPI  The differential diagnosis includes  Cholecystitis, pancreatitis, gastroenteritis, appendicitis, diverticulitis The initial plan is to  Will start with GI cocktail, basic labs Additional history obtained: Additional history obtained from group home staff member Records reviewed previous admission documents  Initial Assessment:   Patient is hemodynamically stable, afebrile, nontoxic-appearing presenting with persistent nausea, vomiting and diarrhea over the past few days.  He does demonstrate some mild epigastric tenderness, however he has not been complaining of abdominal pain to staff members.  The symptoms are reported to be fairly regular for the patient.  Will consider imaging based off labs.  Independent ECG interpretation:  None  Independent labs interpretation:  The following labs were independently interpreted:  UA without significant abnormality, CMP with very mild hypokalemia and transaminitis, CBC notable for mild leukocytosis of 12.7  Independent visualization and interpretation of imaging: I independently visualized the following imaging with scope of interpretation limited to determining acute life threatening conditions related to emergency care: CT abd pelvis, which revealed SBO secondary to incarcerated inguinal hernia.  Signs of strangulation.  Treatment and Reassessment: Patient given GI cocktail and Zofran following first assessment  3:40 PM morphine was given and an attempt was made to manually reduce and the hernia.  This was unsuccessful.  There is no evidence of skin changes or strangulation.  Consultations obtained:   General Surgery Dr.  Azucena Cecil, recommended attempting to reduce an incarcerated hernia.  His last fall recommended transferring patient ED to ED to [facility].  Called [facility], talk to Dr. Joylene Grapes in the ED, agreed to send patient to [facility]  Disposition:   Patient will be transferred ED to ED transfer to [facility] for small bowel obstruction secondary to incarcerated inguinal hernia  Social Determinants of Health:   None  This note was dictated with voice recognition software.  Despite best efforts at proofreading, errors may have occurred which can change the documentation meaning.          Final Clinical Impression(s) / ED Diagnoses Final diagnoses:  Small bowel obstruction (HCC)  Incarcerated inguinal hernia    Rx / DC Orders ED Discharge Orders     None         Halford Decamp, PA-C 10/21/23 1612    Edwin Dada P, DO 10/22/23 1504

## 2023-10-21 NOTE — H&P (Signed)
Reason for Consult/Chief Complaint: incarcerated left inguinal hernia Consultant: Rosette Reveal, [place]  Blake Richards is an 67 y.o. male.   HPI: 54M with autism and well-controlled epilepsy who lives in a group home presents with vomiting and diarrhea. CT notable for SBO 2/2 incarcerated left inguinal hernia.   Past Medical History:  Diagnosis Date   Autism    Epilepsy (HCC)    Hypertension    Thyroid disease     History reviewed. No pertinent surgical history.  History reviewed. No pertinent family history.  Social History:  reports that he has never smoked. He has never used smokeless tobacco. He reports that he does not drink alcohol and does not use drugs.  Allergies:  Allergies  Allergen Reactions   Tropicamide Other (See Comments)    seizures   Lactose Intolerance (Gi) Other (See Comments)    Upset stomach    Medications: I have reviewed the patient's current medications.  Results for orders placed or performed during the hospital encounter of 10/21/23 (from the past 48 hours)  CBC with Differential     Status: Abnormal   Collection Time: 10/21/23 11:34 AM  Result Value Ref Range   WBC 12.7 (H) 4.0 - 10.5 K/uL   RBC 5.10 4.22 - 5.81 MIL/uL   Hemoglobin 15.8 13.0 - 17.0 g/dL   HCT 43.3 29.5 - 18.8 %   MCV 93.3 80.0 - 100.0 fL   MCH 31.0 26.0 - 34.0 pg   MCHC 33.2 30.0 - 36.0 g/dL   RDW 41.6 60.6 - 30.1 %   Platelets 287 150 - 400 K/uL   nRBC 0.0 0.0 - 0.2 %   Neutrophils Relative % 84 %   Neutro Abs 10.7 (H) 1.7 - 7.7 K/uL   Lymphocytes Relative 6 %   Lymphs Abs 0.8 0.7 - 4.0 K/uL   Monocytes Relative 9 %   Monocytes Absolute 1.1 (H) 0.1 - 1.0 K/uL   Eosinophils Relative 0 %   Eosinophils Absolute 0.0 0.0 - 0.5 K/uL   Basophils Relative 0 %   Basophils Absolute 0.0 0.0 - 0.1 K/uL   Immature Granulocytes 1 %   Abs Immature Granulocytes 0.07 0.00 - 0.07 K/uL    Comment: Performed at [facility], [address], [place], [place] [address]   Comprehensive metabolic panel     Status: Abnormal   Collection Time: 10/21/23 11:34 AM  Result Value Ref Range   Sodium 142 135 - 145 mmol/L   Potassium 3.3 (L) 3.5 - 5.1 mmol/L   Chloride 106 98 - 111 mmol/L   CO2 27 22 - 32 mmol/L   Glucose, Bld 116 (H) 70 - 99 mg/dL    Comment: Glucose reference range applies only to samples taken after fasting for at least 8 hours.   BUN 24 (H) 8 - 23 mg/dL   Creatinine, Ser 3.23 0.61 - 1.24 mg/dL   Calcium 9.6 8.9 - 55.7 mg/dL   Total Protein 8.1 6.5 - 8.1 g/dL   Albumin 4.8 3.5 - 5.0 g/dL   AST 57 (H) 15 - 41 U/L   ALT 64 (H) 0 - 44 U/L   Alkaline Phosphatase 104 38 - 126 U/L   Total Bilirubin 0.6 <1.2 mg/dL   GFR, Estimated >32 >20 mL/min    Comment: (NOTE) Calculated using the CKD-EPI Creatinine Equation (2021)    Anion gap 9 5 - 15    Comment: Performed at [facility], [address], [place], [place] [address]  Lipase, blood     Status: None   Collection Time: 10/21/23 11:34 AM  Result Value Ref Range   Lipase 19 11 - 51 U/L    Comment: Performed at [facility], [address], [place], [place] [address]  Urinalysis, Routine w reflex microscopic -Urine, Clean Catch     Status: Abnormal   Collection Time: 10/21/23 11:34 AM  Result Value Ref Range   Color, Urine YELLOW YELLOW   APPearance CLEAR CLEAR   Specific Gravity, Urine 1.040 (H) 1.005 - 1.030   pH 6.0 5.0 - 8.0   Glucose, UA NEGATIVE NEGATIVE mg/dL   Hgb urine dipstick TRACE (A) NEGATIVE   Bilirubin Urine NEGATIVE NEGATIVE   Ketones, ur NEGATIVE NEGATIVE mg/dL   Protein, ur 528 (A) NEGATIVE mg/dL   Nitrite NEGATIVE NEGATIVE   Leukocytes,Ua NEGATIVE NEGATIVE   RBC / HPF 0-5 0 - 5 RBC/hpf   WBC, UA 0-5 0 - 5 WBC/hpf   Bacteria, UA NONE SEEN NONE SEEN   Squamous Epithelial / HPF 0-5 0 - 5 /HPF   Mucus PRESENT     Comment: Performed at [facility], [address], [place], [place] [address]  I-Stat CG4 Lactic  Acid     Status: None   Collection Time: 10/21/23  6:59 PM  Result Value Ref Range   Lactic Acid, Venous 1.2 0.5 - 1.9 mmol/L    CT ABDOMEN PELVIS W CONTRAST Result Date: 10/21/2023 CLINICAL DATA:  Nausea and vomiting for 2 days. EXAM: CT ABDOMEN AND PELVIS WITH CONTRAST TECHNIQUE: Multidetector CT imaging of the abdomen and pelvis was performed using the standard protocol following bolus administration of intravenous contrast. RADIATION DOSE REDUCTION: This exam was performed according to the departmental dose-optimization program which includes automated exposure control, adjustment of the mA and/or kV according to patient size and/or use of iterative reconstruction technique. CONTRAST:  OMNIPAQUE IOHEXOL 300 MG/ML  SOLN COMPARISON:  None Available. FINDINGS: Lower chest: The lung bases are clear of acute process. No pleural effusion or pulmonary lesions. The heart is normal in size. No pericardial effusion. The distal esophagus and aorta are unremarkable. Hepatobiliary: No focal liver abnormality is seen. No gallstones, gallbladder wall thickening, or biliary dilatation. Pancreas: Unremarkable. No pancreatic ductal dilatation or surrounding inflammatory changes. Spleen: Normal in size without focal abnormality. Adrenals/Urinary Tract: The adrenal glands are normal. 10 cm left renal cyst not requiring any further imaging evaluation or follow-up. The right kidney is unremarkable. No renal or obstructing ureteral calculi. No bladder calculi. No bladder lesions. Stomach/Bowel: The stomach is distended with fluid. There is also fluid throughout the duodenum and there is markedly distended fluid-filled small bowel with air-fluid levels consistent with small-bowel obstruction. This is due to an incarcerated left inguinal hernia. No pneumatosis. The colon is unremarkable. The appendix is normal. Vascular/Lymphatic: The aorta is normal in caliber. No dissection. The branch vessels are patent. The major  venous structures are patent. No mesenteric or retroperitoneal mass or adenopathy. Small scattered lymph nodes are noted. Reproductive: The prostate gland and seminal vesicles are unremarkable. Other: No pelvic mass or adenopathy. No free pelvic fluid collections. No inguinal mass or adenopathy. No abdominal wall hernia or subcutaneous lesions. Musculoskeletal: Age advanced degenerative lumbar spondylosis but no acute bony findings or bone lesions. IMPRESSION: 1. High-grade small-bowel obstruction due to a large incarcerated left inguinal hernia. No pneumatosis. 2. No renal or obstructing ureteral calculi. 3. 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 Electronically Signed   By: Rudie Meyer  M.D.   On: 10/21/2023 13:56    ROS 10 point review of systems is negative except as listed above in HPI.   Physical Exam Blood pressure (!) 155/93, pulse 73, temperature 98.3 F (36.8 C), temperature source Oral, resp. rate 16, weight 88.5 kg, SpO2 100%. Constitutional: well-developed, well-nourished HEENT: pupils equal, round, reactive to light, 2mm b/l, moist conjunctiva, external inspection of ears and nose normal, hearing intact Oropharynx: normal oropharyngeal mucosa, normal dentition Neck: no thyromegaly, trachea midline, no midline cervical tenderness to palpation Chest: breath sounds equal bilaterally, normal respiratory effort, no midline or lateral chest wall tenderness to palpation/deformity Abdomen: soft, NT, no bruising, no hepatosplenomegaly GU: no blood at urethral meatus of penis, no scrotal masses or abnormality  Back: no wounds, no thoracic/lumbar spine tenderness to palpation, no thoracic/lumbar spine stepoffs Skin: warm, dry, no rashes Psych: normal memory, normal mood/affect     Assessment/Plan: Incarcerated LIH - unable to reduce, to OR emergently for open LIHR with mesh. Patient not consentable. Informed consent was obtained from the patient's guardian after detailed explanation  of risks, including bleeding, infection, hematoma/seroma, decreased fertility, temporary or permanent neuropathy, hernia recurrence, and mesh infection requiring explantation. All questions answered to the guardian's satisfaction. FEN - strict NPO DVT - SCDs,    Dispo -  admit to inpatient, location to be determined post-op    [phone] Paula Compton, guardian, reports that he is the patient's HC-POA, no document on file. Patient is the first cousin to Mr. Marinell Blight ex-wife. Has been caretaker since the death of the patient's mother and 7 years ago transitioned to a group home but remains the patient's guardian.   Diamantina Monks, MD General and Trauma Surgery [facility]

## 2023-10-21 NOTE — ED Triage Notes (Signed)
Vomiting and diarrhea for 2 days, can't keep anything down. Lives in group home, there is a stomach bug going around.

## 2023-10-21 NOTE — ED Notes (Signed)
Called [facility] for Ed to Ed tx via [facility]; accepting physician, Glendora Score, MD

## 2023-10-21 NOTE — Anesthesia Procedure Notes (Addendum)
Procedure Name: Intubation Date/Time: 10/21/2023 7:58 PM  Performed by: Rachel Moulds, CRNAPre-anesthesia Checklist: Patient identified, Emergency Drugs available, Suction available, Patient being monitored and Timeout performed Patient Re-evaluated:Patient Re-evaluated prior to induction Oxygen Delivery Method: Circle system utilized Preoxygenation: Pre-oxygenation with 100% oxygen Induction Type: Rapid sequence and Cricoid Pressure applied Laryngoscope Size: Mac and 4 Grade View: Grade I Tube type: Oral Tube size: 7.5 mm Number of attempts: 1 Airway Equipment and Method: Stylet Placement Confirmation: breath sounds checked- equal and bilateral, CO2 detector, positive ETCO2 and ETT inserted through vocal cords under direct vision Secured at: 23 cm Tube secured with: Tape Dental Injury: Teeth and Oropharynx as per pre-operative assessment  Comments: Head of bed elevated 30 degrees, NGT to suction.

## 2023-10-21 NOTE — ED Notes (Signed)
PT in reverse trendelenburg and ice applied to groin. Provider will attempt to reduce in ED.

## 2023-10-22 ENCOUNTER — Encounter (HOSPITAL_COMMUNITY): Payer: Self-pay | Admitting: Surgery

## 2023-10-22 DIAGNOSIS — G40919 Epilepsy, unspecified, intractable, without status epilepticus: Secondary | ICD-10-CM | POA: Diagnosis not present

## 2023-10-22 DIAGNOSIS — K4030 Unilateral inguinal hernia, with obstruction, without gangrene, not specified as recurrent: Secondary | ICD-10-CM | POA: Diagnosis present

## 2023-10-22 DIAGNOSIS — E876 Hypokalemia: Secondary | ICD-10-CM | POA: Diagnosis not present

## 2023-10-22 DIAGNOSIS — R112 Nausea with vomiting, unspecified: Secondary | ICD-10-CM | POA: Diagnosis present

## 2023-10-22 DIAGNOSIS — E739 Lactose intolerance, unspecified: Secondary | ICD-10-CM | POA: Diagnosis present

## 2023-10-22 DIAGNOSIS — R569 Unspecified convulsions: Secondary | ICD-10-CM | POA: Diagnosis not present

## 2023-10-22 DIAGNOSIS — Z79899 Other long term (current) drug therapy: Secondary | ICD-10-CM | POA: Diagnosis not present

## 2023-10-22 DIAGNOSIS — Z79890 Hormone replacement therapy (postmenopausal): Secondary | ICD-10-CM | POA: Diagnosis not present

## 2023-10-22 DIAGNOSIS — E039 Hypothyroidism, unspecified: Secondary | ICD-10-CM | POA: Diagnosis present

## 2023-10-22 DIAGNOSIS — I10 Essential (primary) hypertension: Secondary | ICD-10-CM | POA: Diagnosis present

## 2023-10-22 DIAGNOSIS — K567 Ileus, unspecified: Secondary | ICD-10-CM | POA: Diagnosis not present

## 2023-10-22 DIAGNOSIS — Z888 Allergy status to other drugs, medicaments and biological substances status: Secondary | ICD-10-CM | POA: Diagnosis not present

## 2023-10-22 DIAGNOSIS — K4090 Unilateral inguinal hernia, without obstruction or gangrene, not specified as recurrent: Secondary | ICD-10-CM

## 2023-10-22 DIAGNOSIS — G40909 Epilepsy, unspecified, not intractable, without status epilepticus: Secondary | ICD-10-CM | POA: Diagnosis present

## 2023-10-22 DIAGNOSIS — F840 Autistic disorder: Secondary | ICD-10-CM | POA: Diagnosis present

## 2023-10-22 LAB — CBC
HCT: 42.8 % (ref 39.0–52.0)
Hemoglobin: 14 g/dL (ref 13.0–17.0)
MCH: 31.1 pg (ref 26.0–34.0)
MCHC: 32.7 g/dL (ref 30.0–36.0)
MCV: 95.1 fL (ref 80.0–100.0)
Platelets: 212 10*3/uL (ref 150–400)
RBC: 4.5 MIL/uL (ref 4.22–5.81)
RDW: 13.8 % (ref 11.5–15.5)
WBC: 7.6 10*3/uL (ref 4.0–10.5)
nRBC: 0 % (ref 0.0–0.2)

## 2023-10-22 LAB — BASIC METABOLIC PANEL
Anion gap: 4 — ABNORMAL LOW (ref 5–15)
BUN: 21 mg/dL (ref 8–23)
CO2: 27 mmol/L (ref 22–32)
Calcium: 8.3 mg/dL — ABNORMAL LOW (ref 8.9–10.3)
Chloride: 112 mmol/L — ABNORMAL HIGH (ref 98–111)
Creatinine, Ser: 1.04 mg/dL (ref 0.61–1.24)
GFR, Estimated: >60 mL/min (ref >60)
Glucose, Bld: 108 mg/dL — ABNORMAL HIGH (ref 70–99)
Potassium: 4.2 mmol/L (ref 3.5–5.1)
Sodium: 143 mmol/L (ref 135–145)

## 2023-10-22 NOTE — Plan of Care (Signed)

## 2023-10-22 NOTE — TOC Initial Note (Signed)
Transition of Care [facility]) - Initial/Assessment Note    Patient Details  Name: Blake Richards MRN: [id number] Date of Birth: [birth year]  Transition of Care [facility]) CM/SW Contact:    Kingsley Plan, RN Phone Number: 10/22/2023, 2:02 PM  Clinical Narrative:                  Spoke to patient at bedside. No visitors present.   Called POA James 336 [phone]. [facility] from [facility], contact there is April Evans [phone]. Plan is Dirk will return there at discharge. NCM called April Evans and left a voicemail.   Expected Discharge Plan: Home/Self Care Barriers to Discharge: Continued Medical Work up   Patient Goals and CMS Choice Patient states their goals for this hospitalization and ongoing recovery are:: to return to home          Expected Discharge Plan and Services       Living arrangements for the past 2 months: Group Home                 DME Arranged: N/A DME Agency: NA       HH Arranged: NA HH Agency: NA        Prior Living Arrangements/Services Living arrangements for the past 2 months: Group Home Lives with:: Roommate Patient language and need for interpreter reviewed:: Yes Do you feel safe going back to the place where you live?: Yes      Need for Family Participation in Patient Care: Yes (Comment) Care giver support system in place?: Yes (comment)   Criminal Activity/Legal Involvement Pertinent to Current Situation/Hospitalization: No - Comment as needed  Activities of Daily Living   ADL Screening (condition at time of admission) Independently performs ADLs?: Yes (appropriate for developmental age) Is the patient deaf or have difficulty hearing?: No Does the patient have difficulty seeing, even when wearing glasses/contacts?: No Does the patient have difficulty concentrating, remembering, or making decisions?: Yes  Permission Sought/Granted   Permission granted to share information with : Yes, Verbal Permission Granted  Share  Information with NAMEFayrene Fearing [phone], April Evans [phone]           Emotional Assessment Appearance:: Appears stated age     Orientation: : Oriented to Self Alcohol / Substance Use: Not Applicable Psych Involvement: No (comment)  Admission diagnosis:  Small bowel obstruction (HCC) [K56.609] Incarcerated inguinal hernia [K40.30] Status post surgery [Z98.890] Inguinal hernia with bowel obstruction [K40.30] Inguinal hernia [K40.90] Patient Active Problem List   Diagnosis Date Noted   Inguinal hernia 10/22/2023   Status post surgery 10/21/2023   Inguinal hernia with bowel obstruction 10/21/2023   Hypothyroidism 10/06/2015   Epilepsy (HCC) 09/06/2015   Autism 09/06/2015   PCP:  Macy Mis, MD Pharmacy:   [facility] [place], [place] - [address] [place] [address] Phone: [phone] Fax: [phone]  [id number] [place], [place] - [address] AT [facility] [address] [place] [facility] : [facility] [address] Phone: [phone] Fax: [phone]     Social Drivers of Health (SDOH) Social History: SDOH Screenings   Food Insecurity: No Food Insecurity (10/21/2023)  Housing: Low Risk  (10/22/2023)  Transportation Needs: No Transportation Needs (10/21/2023)  Utilities: Not At Risk (10/21/2023)  Depression (PHQ2-9): Low Risk  (01/11/2021)  Financial Resource Strain: Low Risk  (03/12/2023)   Received from [facility]  Physical Activity:  Insufficiently Active (03/12/2023)   Received from [facility]  Social Connections: Socially Integrated (03/12/2023)   Received from [facility]  Stress: No Stress Concern Present (03/12/2023)   Received from [facility]  Tobacco Use: Low Risk  (10/21/2023)   SDOH Interventions:     Readmission Risk Interventions     No data to display

## 2023-10-22 NOTE — Progress Notes (Signed)
Progress Note  1 Day Post-Op  Subjective: No complaints. Denies pain. Passing flatus. No BM. Denies SHOB  Caregiver at bedside  Objective: Vital signs in last 24 hours: Temp:  [97.3 F (36.3 C)-99.5 F (37.5 C)] 98.1 F (36.7 C) (12/16 0822) Pulse Rate:  [64-83] 73 (12/16 0822) Resp:  [13-20] 18 (12/16 0822) BP: (119-172)/(60-106) 119/60 (12/16 0822) SpO2:  [94 %-100 %] 98 % (12/16 0822) Weight:  [88.5 kg] 88.5 kg (12/15 2200) Last BM Date : 10/21/23  Intake/Output from previous day: 12/15 0701 - 12/16 0700 In: 2050 [P.O.:100; I.V.:1200; IV Piggyback:750] Out: 1370 [Urine:300; Blood:20] Intake/Output this shift: Total I/O In: 203.3 [I.V.:203.3] Out: -   PE: General: pleasant, WD, male who is laying in bed in NAD Heart: regular rate Lungs: CTAB.  Respiratory effort nonlabored on supplemental O2 Abd: soft, NT, mild distension. Hypoactive BS. Inguinal incision cdi with glue. NGT without scant bilious output MSK: all 4 extremities are symmetrical with no cyanosis, clubbing, or edema. Skin: warm and dry  Psych: A&Ox3 with an appropriate affect.    Lab Results:  Recent Labs    10/21/23 1134 10/22/23 0445  WBC 12.7* 7.6  HGB 15.8 14.0  HCT 47.6 42.8  PLT 287 212   BMET Recent Labs    10/21/23 1134 10/22/23 0445  NA 142 143  K 3.3* 4.2  CL 106 112*  CO2 27 27  GLUCOSE 116* 108*  BUN 24* 21  CREATININE 1.15 1.04  CALCIUM 9.6 8.3*   PT/INR No results for input(s): "LABPROT", "INR" in the last 72 hours. CMP     Component Value Date/Time   NA 143 10/22/2023 0445   K 4.2 10/22/2023 0445   CL 112 (H) 10/22/2023 0445   CO2 27 10/22/2023 0445   GLUCOSE 108 (H) 10/22/2023 0445   BUN 21 10/22/2023 0445   CREATININE 1.04 10/22/2023 0445   CALCIUM 8.3 (L) 10/22/2023 0445   PROT 8.1 10/21/2023 1134   ALBUMIN 4.8 10/21/2023 1134   AST 57 (H) 10/21/2023 1134   ALT 64 (H) 10/21/2023 1134   ALKPHOS 104 10/21/2023 1134   BILITOT 0.6 10/21/2023 1134    GFRNONAA >60 10/22/2023 0445   GFRAA >60 01/15/2019 2347   Lipase     Component Value Date/Time   LIPASE 19 10/21/2023 1134       Studies/Results: DG Abdomen 1 View Result Date: 10/21/2023 CLINICAL DATA:  NGT placement EXAM: ABDOMEN - 1 VIEW COMPARISON:  10/30/2013. FINDINGS: Limited image that includes the upper abdomen demonstrates an NG tube with the tip superimposed with the stomach below the diaphragm. IMPRESSION: NG tube in place. Electronically Signed   By: Layla Maw M.D.   On: 10/21/2023 19:46   CT ABDOMEN PELVIS W CONTRAST Result Date: 10/21/2023 CLINICAL DATA:  Nausea and vomiting for 2 days. EXAM: CT ABDOMEN AND PELVIS WITH CONTRAST TECHNIQUE: Multidetector CT imaging of the abdomen and pelvis was performed using the standard protocol following bolus administration of intravenous contrast. RADIATION DOSE REDUCTION: This exam was performed according to the departmental dose-optimization program which includes automated exposure control, adjustment of the mA and/or kV according to patient size and/or use of iterative reconstruction technique. CONTRAST:  OMNIPAQUE IOHEXOL 300 MG/ML  SOLN COMPARISON:  None Available. FINDINGS: Lower chest: The lung bases are clear of acute process. No pleural effusion or pulmonary lesions. The heart is normal in size. No pericardial effusion. The distal esophagus and aorta are unremarkable. Hepatobiliary: No focal liver abnormality is seen. No  gallstones, gallbladder wall thickening, or biliary dilatation. Pancreas: Unremarkable. No pancreatic ductal dilatation or surrounding inflammatory changes. Spleen: Normal in size without focal abnormality. Adrenals/Urinary Tract: The adrenal glands are normal. 10 cm left renal cyst not requiring any further imaging evaluation or follow-up. The right kidney is unremarkable. No renal or obstructing ureteral calculi. No bladder calculi. No bladder lesions. Stomach/Bowel: The stomach is distended with fluid.  There is also fluid throughout the duodenum and there is markedly distended fluid-filled small bowel with air-fluid levels consistent with small-bowel obstruction. This is due to an incarcerated left inguinal hernia. No pneumatosis. The colon is unremarkable. The appendix is normal. Vascular/Lymphatic: The aorta is normal in caliber. No dissection. The branch vessels are patent. The major venous structures are patent. No mesenteric or retroperitoneal mass or adenopathy. Small scattered lymph nodes are noted. Reproductive: The prostate gland and seminal vesicles are unremarkable. Other: No pelvic mass or adenopathy. No free pelvic fluid collections. No inguinal mass or adenopathy. No abdominal wall hernia or subcutaneous lesions. Musculoskeletal: Age advanced degenerative lumbar spondylosis but no acute bony findings or bone lesions. IMPRESSION: 1. High-grade small-bowel obstruction due to a large incarcerated left inguinal hernia. No pneumatosis. 2. No renal or obstructing ureteral calculi. 3. 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 Electronically Signed   By: Rudie Meyer M.D.   On: 10/21/2023 13:56    Anti-infectives: Anti-infectives (From admission, onward)    None        Assessment/Plan Incarcerated indirect inguinal hernia POD1 s/p left inguinal hernia repair with mesh 12/15 Dr. Bedelia Person  - passing flatus and pain controlled. 1000 ml charted in "other" on ins/outs from PACU. Discussed with RN and not sure if this is NGT output. Given volume will clamp NGT and allow CLD. If tolerates the NGT out this afternoon  FEN: Clamp NGT, CLD VTE: lovenox  I reviewed last 24 h vitals and pain scores, last 48 h intake and output, last 24 h labs and trends, and last 24 h imaging results.    LOS: 1 day   Eric Form, [facility] Surgery 10/22/2023, 9:59 AM Please see Amion for pager number during day hours 7:00am-4:30pm

## 2023-10-22 NOTE — Progress Notes (Signed)
   10/22/23 1220  Mobility  Activity Transferred from bed to chair  Level of Assistance Contact guard assist, steadying assist  Assistive Device None  Activity Response Tolerated well  Mobility Referral Yes  Mobility visit 1 Mobility  Mobility Specialist Start Time (ACUTE ONLY) 1207  Mobility Specialist Stop Time (ACUTE ONLY) 1220  Mobility Specialist Time Calculation (min) (ACUTE ONLY) 13 min   Mobility Specialist: Progress Note  Pt agreeable to mobility session - received in bed. Required CG with no AD. Pt was asymptomatic throughout session with no complaints. Returned to chair with all needs met - call bell within reach. Chair alarm on. RN present.   Barnie Mort, BS Mobility Specialist Please contact via SecureChat or Rehab office at [phone].

## 2023-10-23 LAB — BASIC METABOLIC PANEL
Anion gap: 9 (ref 5–15)
BUN: 21 mg/dL (ref 8–23)
CO2: 22 mmol/L (ref 22–32)
Calcium: 8.1 mg/dL — ABNORMAL LOW (ref 8.9–10.3)
Chloride: 109 mmol/L (ref 98–111)
Creatinine, Ser: 0.95 mg/dL (ref 0.61–1.24)
GFR, Estimated: >60 mL/min (ref >60)
Glucose, Bld: 97 mg/dL (ref 70–99)
Potassium: 2.6 mmol/L — CL (ref 3.5–5.1)
Sodium: 140 mmol/L (ref 135–145)

## 2023-10-23 LAB — CBC
HCT: 37.4 % — ABNORMAL LOW (ref 39.0–52.0)
Hemoglobin: 12.3 g/dL — ABNORMAL LOW (ref 13.0–17.0)
MCH: 30.9 pg (ref 26.0–34.0)
MCHC: 32.9 g/dL (ref 30.0–36.0)
MCV: 94 fL (ref 80.0–100.0)
Platelets: 194 10*3/uL (ref 150–400)
RBC: 3.98 MIL/uL — ABNORMAL LOW (ref 4.22–5.81)
RDW: 13.5 % (ref 11.5–15.5)
WBC: 6.7 10*3/uL (ref 4.0–10.5)
nRBC: 0 % (ref 0.0–0.2)

## 2023-10-23 LAB — MAGNESIUM: Magnesium: 2.1 mg/dL (ref 1.7–2.4)

## 2023-10-23 MED ORDER — POTASSIUM CHLORIDE 10 MEQ/100ML IV SOLN
10.0000 meq | INTRAVENOUS | Status: AC
  Administered 2023-10-23 (×2): 10 meq via INTRAVENOUS
  Filled 2023-10-23 (×2): qty 100

## 2023-10-23 MED ORDER — POTASSIUM CHLORIDE 20 MEQ PO PACK
40.0000 meq | PACK | Freq: Two times a day (BID) | ORAL | Status: AC
  Administered 2023-10-23 (×2): 40 meq via ORAL
  Filled 2023-10-23 (×2): qty 2

## 2023-10-23 NOTE — Plan of Care (Signed)

## 2023-10-23 NOTE — Progress Notes (Signed)
1900 Assumed of care, received in bed Alert and responsive, watching television at this time.Denies pain or discomfort , assessment complete,respiration even and unlabored.  Fluids offered and within reach, all safety measures in place, call bell at reach. Cont with POC.

## 2023-10-23 NOTE — Progress Notes (Addendum)
Progress Note  2 Days Post-Op  Subjective: Large bowel movements this morning. Pain controlled. Tolerated clears without n/v  Objective: Vital signs in last 24 hours: Temp:  [97.6 F (36.4 C)-98.8 F (37.1 C)] 97.6 F (36.4 C) (12/17 0359) Pulse Rate:  [58-64] 62 (12/17 0359) Resp:  [17-18] 18 (12/17 0359) BP: (119-144)/(72-83) 144/83 (12/17 0359) SpO2:  [96 %-98 %] 97 % (12/17 0359) Last BM Date : 10/23/23  Intake/Output from previous day: 12/16 0701 - 12/17 0700 In: 503.3 [P.O.:300; I.V.:203.3] Out: 550 [Emesis/NG output:550] Intake/Output this shift: No intake/output data recorded.  PE: General: pleasant, WD, male who is sitting up in chair in NAD Heart: regular rate Lungs: Respiratory effort nonlabored on room air Abd: soft, NT, mild distension. Incision cdi with surgical glue MSK: all 4 extremities are symmetrical with no cyanosis, clubbing, or edema. Skin: warm and dry  Psych: A&Ox3 with an appropriate affect.    Lab Results:  Recent Labs    10/22/23 0445 10/23/23 0612  WBC 7.6 6.7  HGB 14.0 12.3*  HCT 42.8 37.4*  PLT 212 194   BMET Recent Labs    10/22/23 0445 10/23/23 0612  NA 143 140  K 4.2 2.6*  CL 112* 109  CO2 27 22  GLUCOSE 108* 97  BUN 21 21  CREATININE 1.04 0.95  CALCIUM 8.3* 8.1*   PT/INR No results for input(s): "LABPROT", "INR" in the last 72 hours. CMP     Component Value Date/Time   NA 140 10/23/2023 0612   K 2.6 (LL) 10/23/2023 0612   CL 109 10/23/2023 0612   CO2 22 10/23/2023 0612   GLUCOSE 97 10/23/2023 0612   BUN 21 10/23/2023 0612   CREATININE 0.95 10/23/2023 0612   CALCIUM 8.1 (L) 10/23/2023 0612   PROT 8.1 10/21/2023 1134   ALBUMIN 4.8 10/21/2023 1134   AST 57 (H) 10/21/2023 1134   ALT 64 (H) 10/21/2023 1134   ALKPHOS 104 10/21/2023 1134   BILITOT 0.6 10/21/2023 1134   GFRNONAA >60 10/23/2023 0612   GFRAA >60 01/15/2019 2347   Lipase     Component Value Date/Time   LIPASE 19 10/21/2023 1134        Studies/Results: DG Abdomen 1 View Result Date: 10/21/2023 CLINICAL DATA:  NGT placement EXAM: ABDOMEN - 1 VIEW COMPARISON:  10/30/2013. FINDINGS: Limited image that includes the upper abdomen demonstrates an NG tube with the tip superimposed with the stomach below the diaphragm. IMPRESSION: NG tube in place. Electronically Signed   By: Layla Maw M.D.   On: 10/21/2023 19:46   CT ABDOMEN PELVIS W CONTRAST Result Date: 10/21/2023 CLINICAL DATA:  Nausea and vomiting for 2 days. EXAM: CT ABDOMEN AND PELVIS WITH CONTRAST TECHNIQUE: Multidetector CT imaging of the abdomen and pelvis was performed using the standard protocol following bolus administration of intravenous contrast. RADIATION DOSE REDUCTION: This exam was performed according to the departmental dose-optimization program which includes automated exposure control, adjustment of the mA and/or kV according to patient size and/or use of iterative reconstruction technique. CONTRAST:  OMNIPAQUE IOHEXOL 300 MG/ML  SOLN COMPARISON:  None Available. FINDINGS: Lower chest: The lung bases are clear of acute process. No pleural effusion or pulmonary lesions. The heart is normal in size. No pericardial effusion. The distal esophagus and aorta are unremarkable. Hepatobiliary: No focal liver abnormality is seen. No gallstones, gallbladder wall thickening, or biliary dilatation. Pancreas: Unremarkable. No pancreatic ductal dilatation or surrounding inflammatory changes. Spleen: Normal in size without focal abnormality. Adrenals/Urinary Tract:  The adrenal glands are normal. 10 cm left renal cyst not requiring any further imaging evaluation or follow-up. The right kidney is unremarkable. No renal or obstructing ureteral calculi. No bladder calculi. No bladder lesions. Stomach/Bowel: The stomach is distended with fluid. There is also fluid throughout the duodenum and there is markedly distended fluid-filled small bowel with air-fluid levels  consistent with small-bowel obstruction. This is due to an incarcerated left inguinal hernia. No pneumatosis. The colon is unremarkable. The appendix is normal. Vascular/Lymphatic: The aorta is normal in caliber. No dissection. The branch vessels are patent. The major venous structures are patent. No mesenteric or retroperitoneal mass or adenopathy. Small scattered lymph nodes are noted. Reproductive: The prostate gland and seminal vesicles are unremarkable. Other: No pelvic mass or adenopathy. No free pelvic fluid collections. No inguinal mass or adenopathy. No abdominal wall hernia or subcutaneous lesions. Musculoskeletal: Age advanced degenerative lumbar spondylosis but no acute bony findings or bone lesions. IMPRESSION: 1. High-grade small-bowel obstruction due to a large incarcerated left inguinal hernia. No pneumatosis. 2. No renal or obstructing ureteral calculi. 3. 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 Electronically Signed   By: Rudie Meyer M.D.   On: 10/21/2023 13:56    Anti-infectives: Anti-infectives (From admission, onward)    None        Assessment/Plan Incarcerated indirect inguinal hernia POD2 s/p left inguinal hernia repair with mesh 12/15 Dr. Bedelia Person  - tolerated CLD and BM this am - advance to soft - hypokalemia - replete - possible pm d/c pending diet tolerance  FEN: soft VTE: lovenox  ADDENDUM: 12/17 1505 I spoke with patient's legal guardian Paula Compton  I reviewed last 24 h vitals and pain scores, last 48 h intake and output, last 24 h labs and trends, and last 24 h imaging results.    LOS: 2 days   Eric Form, [facility] Surgery 10/23/2023, 10:38 AM Please see Amion for pager number during day hours 7:00am-4:30pm

## 2023-10-23 NOTE — Progress Notes (Signed)
   10/23/23 0940  Mobility  Activity Ambulated with assistance in hallway  Level of Assistance Contact guard assist, steadying assist  Assistive Device None  Distance Ambulated (ft) 225 ft  Activity Response Tolerated fair  Mobility Referral Yes  Mobility visit 1 Mobility  Mobility Specialist Start Time (ACUTE ONLY) 0920  Mobility Specialist Stop Time (ACUTE ONLY) 0940  Mobility Specialist Time Calculation (min) (ACUTE ONLY) 20 min   Mobility Specialist: Progress Note  Pt agreeable to mobility session - received in chair. Required CG using no AD. C/o nausea and lower back pain. Returned to chair with all needs met - call bell within reach. Chair alarm on. Vistor present stated she was "staff" from the group home.   Barnie Mort, BS Mobility Specialist Please contact via SecureChat or Rehab office at [phone].

## 2023-10-23 NOTE — Plan of Care (Signed)
  Problem: Clinical Measurements: Goal: Ability to maintain clinical measurements within normal limits will improve Outcome: Progressing   Problem: Clinical Measurements: Goal: Will remain free from infection Outcome: Progressing   Problem: Nutrition: Goal: Adequate nutrition will be maintained Outcome: Progressing   Problem: Activity: Goal: Risk for activity intolerance will decrease Outcome: Progressing   Problem: Coping: Goal: Level of anxiety will decrease Outcome: Progressing

## 2023-10-23 NOTE — Progress Notes (Signed)
   10/23/23 1530  Mobility  Activity Ambulated with assistance to bathroom  Level of Assistance Contact guard assist, steadying assist  Assistive Device None  Distance Ambulated (ft) 20 ft  Range of Motion/Exercises Active  Activity Response Tolerated well;Tolerated fair  Mobility Referral Yes  Mobility visit 1 Mobility  Mobility Specialist Start Time (ACUTE ONLY) 1500  Mobility Specialist Stop Time (ACUTE ONLY) 1530  Mobility Specialist Time Calculation (min) (ACUTE ONLY) 30 min   Mobility Specialist: Progress Note  Post-Mobility: HR 91, SpO2 94% RA  Pt agreeable to mobility session - received in chair. Required CG using HHA. C/o abd pain throughout. Pt with diarrhea - RN aware. Returned to bed with all needs met - call bell within reach. Bed alarm on.   Barnie Mort, BS Mobility Specialist Please contact via SecureChat or Rehab office at [phone].

## 2023-10-24 ENCOUNTER — Inpatient Hospital Stay (HOSPITAL_COMMUNITY): Payer: Medicare HMO

## 2023-10-24 DIAGNOSIS — G40909 Epilepsy, unspecified, not intractable, without status epilepticus: Secondary | ICD-10-CM

## 2023-10-24 DIAGNOSIS — R569 Unspecified convulsions: Secondary | ICD-10-CM

## 2023-10-24 LAB — CBC
HCT: 36.6 % — ABNORMAL LOW (ref 39.0–52.0)
HCT: 36.4 % — ABNORMAL LOW (ref 39.0–52.0)
Hemoglobin: 12.3 g/dL — ABNORMAL LOW (ref 13.0–17.0)
Hemoglobin: 12.1 g/dL — ABNORMAL LOW (ref 13.0–17.0)
MCH: 30.8 pg (ref 26.0–34.0)
MCH: 30.7 pg (ref 26.0–34.0)
MCHC: 33.6 g/dL (ref 30.0–36.0)
MCHC: 33.2 g/dL (ref 30.0–36.0)
MCV: 91.7 fL (ref 80.0–100.0)
MCV: 92.4 fL (ref 80.0–100.0)
Platelets: 205 10*3/uL (ref 150–400)
Platelets: 198 10*3/uL (ref 150–400)
RBC: 3.99 MIL/uL — ABNORMAL LOW (ref 4.22–5.81)
RBC: 3.94 MIL/uL — ABNORMAL LOW (ref 4.22–5.81)
RDW: 13.2 % (ref 11.5–15.5)
RDW: 13.2 % (ref 11.5–15.5)
WBC: 7.3 10*3/uL (ref 4.0–10.5)
WBC: 6.3 10*3/uL (ref 4.0–10.5)
nRBC: 0 % (ref 0.0–0.2)
nRBC: 0 % (ref 0.0–0.2)

## 2023-10-24 LAB — BASIC METABOLIC PANEL
Anion gap: 6 (ref 5–15)
Anion gap: 9 (ref 5–15)
BUN: 12 mg/dL (ref 8–23)
BUN: 10 mg/dL (ref 8–23)
CO2: 23 mmol/L (ref 22–32)
CO2: 22 mmol/L (ref 22–32)
Calcium: 8 mg/dL — ABNORMAL LOW (ref 8.9–10.3)
Calcium: 8.2 mg/dL — ABNORMAL LOW (ref 8.9–10.3)
Chloride: 108 mmol/L (ref 98–111)
Chloride: 107 mmol/L (ref 98–111)
Creatinine, Ser: 0.9 mg/dL (ref 0.61–1.24)
Creatinine, Ser: 0.88 mg/dL (ref 0.61–1.24)
GFR, Estimated: >60 mL/min (ref >60)
GFR, Estimated: >60 mL/min (ref >60)
Glucose, Bld: 112 mg/dL — ABNORMAL HIGH (ref 70–99)
Glucose, Bld: 107 mg/dL — ABNORMAL HIGH (ref 70–99)
Potassium: 2.7 mmol/L — CL (ref 3.5–5.1)
Potassium: 3.1 mmol/L — ABNORMAL LOW (ref 3.5–5.1)
Sodium: 137 mmol/L (ref 135–145)
Sodium: 138 mmol/L (ref 135–145)

## 2023-10-24 LAB — MAGNESIUM
Magnesium: 2.3 mg/dL (ref 1.7–2.4)
Magnesium: 2.3 mg/dL (ref 1.7–2.4)

## 2023-10-24 MED ORDER — LORAZEPAM 2 MG/ML IJ SOLN: 2.0000 mg | INTRAMUSCULAR | Status: DC | PRN

## 2023-10-24 MED ORDER — LEVETIRACETAM IN NACL 1000 MG/100ML IV SOLN
1000.0000 mg | Freq: Two times a day (BID) | INTRAVENOUS | Status: DC
  Administered 2023-10-24: 1000 mg via INTRAVENOUS
  Filled 2023-10-24 (×2): qty 100

## 2023-10-24 MED ORDER — SODIUM CHLORIDE 0.9 % IV SOLN
5.0000 mg/kg | Freq: Once | INTRAVENOUS | Status: AC
  Administered 2023-10-24: 442.5 mg via INTRAVENOUS
  Filled 2023-10-24: qty 8.85

## 2023-10-24 MED ORDER — CARBAMAZEPINE 200 MG PO TABS
400.0000 mg | ORAL_TABLET | Freq: Three times a day (TID) | ORAL | Status: DC
  Administered 2023-10-24 – 2023-10-25 (×2): 400 mg via ORAL
  Filled 2023-10-24 (×3): qty 2

## 2023-10-24 MED ORDER — PHENYTOIN SODIUM 50 MG/ML IJ SOLN
100.0000 mg | Freq: Three times a day (TID) | INTRAMUSCULAR | Status: DC
  Filled 2023-10-24 (×3): qty 2

## 2023-10-24 MED ORDER — LEVETIRACETAM 500 MG PO TABS: 1000.0000 mg | ORAL_TABLET | Freq: Two times a day (BID) | ORAL | Status: DC

## 2023-10-24 MED ORDER — POTASSIUM CHLORIDE 10 MEQ/100ML IV SOLN
10.0000 meq | INTRAVENOUS | Status: AC
  Administered 2023-10-24 – 2023-10-25 (×6): 10 meq via INTRAVENOUS
  Filled 2023-10-24 (×6): qty 100

## 2023-10-24 MED ORDER — LORAZEPAM 2 MG/ML IJ SOLN
INTRAMUSCULAR | Status: AC
  Filled 2023-10-24: qty 1

## 2023-10-24 MED ORDER — LORATADINE 10 MG PO TABS
10.0000 mg | ORAL_TABLET | Freq: Every day | ORAL | Status: DC
  Administered 2023-10-25: 10 mg via ORAL
  Filled 2023-10-24: qty 1

## 2023-10-24 MED ORDER — ACETAZOLAMIDE 250 MG PO TABS
250.0000 mg | ORAL_TABLET | Freq: Every day | ORAL | Status: DC
  Administered 2023-10-25: 250 mg via ORAL
  Filled 2023-10-24 (×4): qty 1

## 2023-10-24 MED ORDER — LEVETIRACETAM 500 MG PO TABS
1000.0000 mg | ORAL_TABLET | Freq: Two times a day (BID) | ORAL | Status: DC
Start: 2023-10-24 — End: 2023-10-24

## 2023-10-24 MED ORDER — POTASSIUM CHLORIDE 10 MEQ/100ML IV SOLN
10.0000 meq | INTRAVENOUS | Status: AC
  Administered 2023-10-24 (×6): 10 meq via INTRAVENOUS
  Filled 2023-10-24 (×6): qty 100

## 2023-10-24 MED ORDER — LEVETIRACETAM 500 MG PO TABS
1000.0000 mg | ORAL_TABLET | Freq: Two times a day (BID) | ORAL | Status: DC
  Administered 2023-10-25: 1000 mg via ORAL
  Filled 2023-10-24 (×2): qty 2

## 2023-10-24 MED ORDER — ALPRAZOLAM 0.25 MG PO TABS: 0.2500 mg | ORAL_TABLET | Freq: Every day | ORAL | Status: DC

## 2023-10-24 MED ORDER — AMLODIPINE BESYLATE 5 MG PO TABS
5.0000 mg | ORAL_TABLET | Freq: Every day | ORAL | Status: DC
  Administered 2023-10-25: 5 mg via ORAL
  Filled 2023-10-24: qty 1

## 2023-10-24 MED ORDER — ALPRAZOLAM 0.25 MG PO TABS
0.2500 mg | ORAL_TABLET | Freq: Every day | ORAL | Status: DC
  Administered 2023-10-24: 0.25 mg via ORAL
  Filled 2023-10-24: qty 1

## 2023-10-24 MED ORDER — CARBAMAZEPINE ER 200 MG PO TB12
600.0000 mg | ORAL_TABLET | Freq: Two times a day (BID) | ORAL | Status: DC
  Filled 2023-10-24 (×2): qty 3

## 2023-10-24 MED ORDER — ORAL CARE MOUTH RINSE: 15.0000 mL | OROMUCOSAL | Status: DC | PRN

## 2023-10-24 MED ORDER — PRAVASTATIN SODIUM 40 MG PO TABS
80.0000 mg | ORAL_TABLET | Freq: Every day | ORAL | Status: DC
  Administered 2023-10-25: 80 mg via ORAL
  Filled 2023-10-24: qty 2

## 2023-10-24 MED ORDER — LEVOTHYROXINE SODIUM 25 MCG PO TABS
25.0000 ug | ORAL_TABLET | Freq: Every day | ORAL | Status: DC
Start: 2023-10-24 — End: 2023-10-25
  Administered 2023-10-25: 25 ug via ORAL
  Filled 2023-10-24: qty 1

## 2023-10-24 MED ORDER — SERTRALINE HCL 100 MG PO TABS
100.0000 mg | ORAL_TABLET | Freq: Every day | ORAL | Status: DC
  Administered 2023-10-25: 100 mg via ORAL
  Filled 2023-10-24: qty 1

## 2023-10-24 MED ORDER — LORAZEPAM 2 MG/ML IJ SOLN: 0.5000 mg | Freq: Every day | INTRAMUSCULAR | Status: DC

## 2023-10-24 MED ORDER — SODIUM CHLORIDE 0.9 % IV SOLN
2000.0000 mg | Freq: Once | INTRAVENOUS | Status: AC
  Administered 2023-10-24: 2000 mg via INTRAVENOUS
  Filled 2023-10-24: qty 20

## 2023-10-24 MED ORDER — LORAZEPAM 2 MG/ML IJ SOLN
2.0000 mg | Freq: Once | INTRAMUSCULAR | Status: AC
  Administered 2023-10-24: 2 mg via INTRAVENOUS

## 2023-10-24 MED ORDER — LEVETIRACETAM IN NACL 1000 MG/100ML IV SOLN: 1000.0000 mg | INTRAVENOUS | Status: DC

## 2023-10-24 NOTE — Anesthesia Postprocedure Evaluation (Signed)
Anesthesia Post Note  Patient: Blake Richards  Procedure(s) Performed: OPEN LEFT HERNIA REPAIR INGUINAL (Left)     Patient location during evaluation: PACU Anesthesia Type: General Level of consciousness: awake and alert Pain management: pain level controlled Vital Signs Assessment: post-procedure vital signs reviewed and stable Respiratory status: spontaneous breathing, nonlabored ventilation, respiratory function stable and patient connected to nasal cannula oxygen Cardiovascular status: blood pressure returned to baseline and stable Postop Assessment: no apparent nausea or vomiting Anesthetic complications: no   No notable events documented.  Last Vitals:  Vitals:   10/24/23 0331 10/24/23 0749  BP: (!) 158/96 (!) 140/86  Pulse: 75 77  Resp:  17  Temp: 36.8 C 36.6 C  SpO2: 97% 95%    Last Pain:  Vitals:   10/24/23 0749  TempSrc: Oral  PainSc:                  Kennieth Rad

## 2023-10-24 NOTE — Progress Notes (Signed)
Patient had boody bm in the bathroom; ( medium, and bright red) vss, see chart. Dr. Azucena Cecil notified, new orders added. Attempted to notify legal guardian, no anwer, voicemail left to call this writer back.

## 2023-10-24 NOTE — Plan of Care (Signed)
  Problem: Education: Goal: Knowledge of General Education information will improve Description: Including pain rating scale, medication(s)/side effects and non-pharmacologic comfort measures Outcome: Progressing   Problem: Activity: Goal: Risk for activity intolerance will decrease Outcome: Progressing   Problem: Nutrition: Goal: Adequate nutrition will be maintained Outcome: Progressing   Problem: Elimination: Goal: Will not experience complications related to bowel motility Outcome: Progressing Goal: Will not experience complications related to urinary retention Outcome: Progressing   Problem: Pain Management: Goal: General experience of comfort will improve Outcome: Progressing

## 2023-10-24 NOTE — Plan of Care (Signed)
Pt tolerating a soft diet so far (soup, roll, sherbert).

## 2023-10-24 NOTE — Progress Notes (Addendum)
Chaplain responded to Code [employer]. Pt was under the care of the medical team. Chaplain remained onsite until the pt was stabilized. No family present at the bedside.   Chaplain is available for follow up should family or patient desire further support.    Blake Richards. Carley Hammed, M.Div. [facility] Chaplain Pager [phone] Office [phone]

## 2023-10-24 NOTE — Progress Notes (Signed)
Pt had seizures x 3. Caregiver staff from the group home at bedside when this happened. Pt had a pulse. Assisted pt in positioning and for airway. Called Rapid response and Earley Brooke, PA for [facility]. Pharmacy at bedside. Cardiac monitoring and continuous pulse ox initiated. Ativan 2 mg IV given and then Keppra 2000mg  IV started. Pt did awake and spoke with [place], HOB up 34 degrees. Johnny Bridge came to bedside as well as RR Nurse Joni Reining. O2 sats are 97 on 2 L nasal O2. Suction set up at bedside. Pt now sleeping.

## 2023-10-24 NOTE — Procedures (Signed)
Patient Name: Blake Richards  MRN: [id number]  Epilepsy Attending: Charlsie Quest  Referring Physician/Provider: Clarise Cruz  Date: 10/24/2023 Duration: 22.47 mins  Patient history: 67 year old male with history of epilepsy who was admitted for SBO and underwent hernia repair.  Had breakthrough seizures today. EEG to evaluate for seizure  Level of alertness: Awake, asleep  AEDs during EEG study: LEV  Technical aspects: This EEG study was done with scalp electrodes positioned according to the 10-20 International system of electrode placement. Electrical activity was reviewed with band pass filter of 1-70Hz , sensitivity of 7 uV/mm, display speed of 33mm/sec with a 60Hz  notched filter applied as appropriate. EEG data were recorded continuously and digitally stored.  Video monitoring was available and reviewed as appropriate.  Description: The posterior dominant rhythm consists of 8 Hz activity of moderate voltage (25-35 uV) seen predominantly in posterior head regions, symmetric and reactive to eye opening and eye closing.  Sleep was characterized by vertex waves, sleep spindles (12 to 14 Hz), maximal frontocentral region. Spikes were noted in left frontal region. Hyperventilation and photic stimulation were not performed.     ABNORMALITY -Spike, left frontal region  IMPRESSION: This study is consistent with patient's history of epilepsy arising from left frontal region. No seizures were seen throughout the recording.  Shevelle Smither Annabelle Harman

## 2023-10-24 NOTE — Progress Notes (Signed)
EEG completed.

## 2023-10-24 NOTE — Progress Notes (Signed)
Vital signs obtained and charted, O2 sat 99, oxygen down to 2L , pulse 64. Pt had urinated during the event and had been cleaned up and bed changed.

## 2023-10-24 NOTE — Progress Notes (Signed)
Called EEG and left message about EEG.

## 2023-10-24 NOTE — Progress Notes (Signed)
Called by bedside nurse for new bright red blood noted with bowel movement this evening.  I examined patient and complains of mild abdominal pain, however says this has been present since admission and is not new. Rectal exam performed, no obvious bleeding internal hemorrhoids. CBC and CT GI bleed ordered.  Donata Duff, MD [facility]

## 2023-10-24 NOTE — Progress Notes (Signed)
Date and time results received: 10/24/23 0816 (use smartphrase ".now" to insert current time)  Test: Potassium Critical Value: 2.7  Name of Provider Notified: Carl Best, PA  Orders Received? Or Actions Taken?: On dept now, will see pt and take care of orders

## 2023-10-24 NOTE — Progress Notes (Signed)
Alert and oriented, no vomiting, abd, softly distended, 1 large loose/watery bm overnight. Potassium replenished, no update on labs yet. Poc continue

## 2023-10-24 NOTE — Progress Notes (Signed)
Pt able to tolerate soft diet, resting comfortably at present. SCDs on and operating. When patient had a BM (loose, liquid with one formed part) earlier today, he also passed about 4 small round pieces that appeared to be perhaps the outer coating of pills. There were collected in a urine cup and Carl Best, PA for [facility] notified and will take a look at them tomorrow on rounds. Held meds earlier due to NPO status, but did give the 1800 tylenol and robaxin and pt swallowed them fine. All IV Potassiums completed. Pt knew name, place ([place] and  with coaching), date. He appeared to me to be at his baseline from my initial assessment this morning before the seizure. Destiny, the staff member from the group home who was visiting the pt when he seized, stated that he had a seizure about 2 months ago while in the shower.

## 2023-10-24 NOTE — Progress Notes (Signed)
EEG here at bedside.

## 2023-10-24 NOTE — Progress Notes (Signed)
   Progress Note  3 Days Post-Op  Subjective: Several bowel movements yesterday but also had nausea with both lunch and dinner and says that he threw up after dinner. He denies worsening abdominal pain   Objective: Vital signs in last 24 hours: Temp:  [97.8 F (36.6 C)-98.3 F (36.8 C)] 97.8 F (36.6 C) (12/18 0749) Pulse Rate:  [75-91] 77 (12/18 0749) Resp:  [16-17] 17 (12/18 0749) BP: (140-165)/(86-111) 140/86 (12/18 0749) SpO2:  [95 %-97 %] 95 % (12/18 0749) Last BM Date : 10/23/23  Intake/Output from previous day: 12/17 0701 - 12/18 0700 In: 900 [P.O.:900] Out: -  Intake/Output this shift: No intake/output data recorded.  PE: General: pleasant, WD, male who is laying in bed in NAD Heart: regular rate Lungs: Respiratory effort nonlabored on room air Abd: soft, NT, mild distension. Incision cdi with surgical glue MSK: all 4 extremities are symmetrical with no cyanosis, clubbing, or edema. Skin: warm and dry  Psych: A&Ox3 with an appropriate affect.    Lab Results:  Recent Labs    10/23/23 0612 10/24/23 0655  WBC 6.7 7.3  HGB 12.3* 12.3*  HCT 37.4* 36.6*  PLT 194 205   BMET Recent Labs    10/23/23 0612 10/24/23 0655  NA 140 137  K 2.6* 2.7*  CL 109 108  CO2 22 23  GLUCOSE 97 112*  BUN 21 12  CREATININE 0.95 0.90  CALCIUM 8.1* 8.0*   PT/INR No results for input(s): "LABPROT", "INR" in the last 72 hours. CMP     Component Value Date/Time   NA 137 10/24/2023 0655   K 2.7 (LL) 10/24/2023 0655   CL 108 10/24/2023 0655   CO2 23 10/24/2023 0655   GLUCOSE 112 (H) 10/24/2023 0655   BUN 12 10/24/2023 0655   CREATININE 0.90 10/24/2023 0655   CALCIUM 8.0 (L) 10/24/2023 0655   PROT 8.1 10/21/2023 1134   ALBUMIN 4.8 10/21/2023 1134   AST 57 (H) 10/21/2023 1134   ALT 64 (H) 10/21/2023 1134   ALKPHOS 104 10/21/2023 1134   BILITOT 0.6 10/21/2023 1134   GFRNONAA >60 10/24/2023 0655   GFRAA >60 01/15/2019 2347   Lipase     Component Value Date/Time    LIPASE 19 10/21/2023 1134       Studies/Results: No results found.   Anti-infectives: Anti-infectives (From admission, onward)    None        Assessment/Plan Incarcerated indirect inguinal hernia POD3 s/p left inguinal hernia repair with mesh 12/15 Dr. Bedelia Person  - nausea with soft diet and reported emesis overnight but was not charted - having bowel movements with xray appearance of ileus. Read pending. Continue soft for now and monitor - hypokalemia - repletion yesterday but ongoing at 2.7 today. Replete IV and check Mag  FEN: soft VTE: lovenox  I spoke with patient's caregiver at bedside  I reviewed last 24 h vitals and pain scores, last 48 h intake and output, last 24 h labs and trends, and last 24 h imaging results.    LOS: 3 days   Eric Form, [facility] Surgery 10/24/2023, 9:55 AM Please see Amion for pager number during day hours 7:00am-4:30pm

## 2023-10-24 NOTE — Progress Notes (Signed)
Holding on the Potassium IVPBs until after EEG completed in case it is irritating to pt.

## 2023-10-24 NOTE — Progress Notes (Signed)
EEG complete - results pending.  CANNOT complete EEG order as it is set to "locked". I have tried to complete the order but it will NOT.

## 2023-10-24 NOTE — Consult Note (Addendum)
Neurology Consultation Reason for Consult: seizure Referring Physician: Carl Best, PA  CC: seizure  History is obtained from: Chart review as patient is altered  HPI: Blake Richards is a 67 y.o. male with past medical history of autism, well-controlled epilepsy who presented from a group home on 10/21/2023 with vomiting and diarrhea.  CT showed evidence of SBO 2/2 incarcerated left inguinal hernia.  Patient underwent left inguinal hernia repair with mesh on  12/15.  Patient was slowly recovering from surgery and attempting p.o. diet.  However this morning was noted to have 3 seizures.  Therefore neurology was consulted.   ROS: Unable to obtain due to altered mental status.   Past Medical History:  Diagnosis Date   Autism    Epilepsy (HCC)    Hypertension    Thyroid disease     Social History: By chart review, reports that he has never smoked. He has never used smokeless tobacco. He reports that he does not drink alcohol and does not use drugs.   Medications Prior to Admission  Medication Sig Dispense Refill Last Dose/Taking   acetaZOLAMIDE (DIAMOX) 250 MG tablet Take 250 mg by mouth daily.   Past Week   ALPRAZolam (XANAX) 0.25 MG tablet TAKE (1) TABLET BY MOUTH ONCE DAILY. 30 tablet 5 Past Week   amLODipine (NORVASC) 5 MG tablet Take 5 mg by mouth daily.   Past Week   carbamazepine (TEGRETOL XR) 200 MG 12 hr tablet Take 3 tablets by mouth 2 (two) times daily. Take 3 tablets in the Morning, and 4 tablets at bedtime.   Past Week   cetirizine (ZYRTEC) 10 MG tablet Take 10 mg by mouth daily.   Past Week   cholecalciferol (VITAMIN D3) 25 MCG (1000 UT) tablet Take 1,000 Units by mouth daily.   Past Week   levETIRAcetam (KEPPRA) 500 MG tablet Take 1,000 mg by mouth 2 (two) times daily. 1,000 in the Morning, and in the evening.   Past Week   levothyroxine (SYNTHROID) 25 MCG tablet Take 25 mcg by mouth.   Past Week   pravastatin (PRAVACHOL) 80 MG tablet Take 80 mg by mouth daily.   Past  Week   sertraline (ZOLOFT) 100 MG tablet Take 1 tablet (100 mg total) by mouth daily. 30 tablet 5 Past Week      Exam: Current vital signs: BP (!) 156/88 (BP Location: Left Arm)   Pulse 62   Temp 97.8 F (36.6 C) (Axillary)   Resp 16   Ht 5\' 10"  (1.778 m)   Wt 88.5 kg   SpO2 99%   BMI 27.99 kg/m  Vital signs in last 24 hours: Temp:  [97.8 F (36.6 C)-98.9 F (37.2 C)] 97.8 F (36.6 C) (12/18 1234) Pulse Rate:  [60-101] 62 (12/18 1234) Resp:  [16-17] 16 (12/18 1234) BP: (140-195)/(84-111) 156/88 (12/18 1234) SpO2:  [95 %-100 %] 99 % (12/18 1234)   Physical Exam  Constitutional: Appears well-developed and well-nourished.  Neuro: Asleep but does wake up to tactile and verbal stimulation, did tell me his name but did not answer any other orientation questions, did attempt to follow simple one-step commands, PERRLA, no forced gaze deviation, antigravity strength in all 4 extremities  I have reviewed labs in epic and the results pertinent to this consultation are: CBC:  Recent Labs  Lab 10/21/23 1134 10/22/23 0445 10/23/23 0612 10/24/23 0655  WBC 12.7*   < > 6.7 7.3  NEUTROABS 10.7*  --   --   --   HGB  15.8   < > 12.3* 12.3*  HCT 47.6   < > 37.4* 36.6*  MCV 93.3   < > 94.0 91.7  PLT 287   < > 194 205   < > = values in this interval not displayed.    Basic Metabolic Panel:  Lab Results  Component Value Date   NA 137 10/24/2023   K 2.7 (LL) 10/24/2023   CO2 23 10/24/2023   GLUCOSE 112 (H) 10/24/2023   BUN 12 10/24/2023   CREATININE 0.90 10/24/2023   CALCIUM 8.0 (L) 10/24/2023   GFRNONAA >60 10/24/2023   GFRAA >60 01/15/2019   Lipid Panel:  Lab Results  Component Value Date   LDLCALC 96 06/05/2018   HgbA1c:  Lab Results  Component Value Date   HGBA1C 5.2 06/05/2018   Urine Drug Screen: No results found for: "LABOPIA", "COCAINSCRNUR", "LABBENZ", "AMPHETMU", "THCU", "LABBARB"  Alcohol Level No results found for: "ETH"   I have reviewed the images  obtained:  CT Head without contrast 08/25/2023: No acute intracranial abnormalities.   ASSESSMENT/PLAN: 67 year old male with history of epilepsy who was admitted for SBO and underwent hernia repair.  Had breakthrough seizures today  Epilepsy with breakthrough seizure -Likely due to missing antiseizure medications  Recommendations: -Patient was loaded with Keppra 2000 mg once - Will resume Keppra 1000 mg twice daily -As patient is not consistently taking p.o. and did not receive carbamazepine for 4 days, will give small loading dose of fosphenytoin.  -As patient is not consistently taking p.o., will order IV phenytoin with option to give p.o. carbamazepine if able to tolerate p.o. instead -Similarly, will order IV Ativan as well as an option to give Xanax if able to tolerate p.o. -Once patient is consistently taking the medication, would recommend resuming home medications at home doses: Keppra 1000 mg twice daily, carbamazepine 600 mg every morning and 800 mg nightly, Xanax 0.25 mg daily -EEG ordered and pending -As needed IV ativan  for clinical seizures -Discussed plan with surgery team   Thank you for allowing [place] to participate in the care of this patient. If you have any further questions, please contact  me or neurohospitalist.   Lindie Spruce Epilepsy [facility]

## 2023-10-25 ENCOUNTER — Telehealth (HOSPITAL_COMMUNITY): Payer: Self-pay

## 2023-10-25 ENCOUNTER — Inpatient Hospital Stay (HOSPITAL_COMMUNITY): Payer: Medicare HMO

## 2023-10-25 ENCOUNTER — Other Ambulatory Visit (HOSPITAL_COMMUNITY): Payer: Self-pay

## 2023-10-25 DIAGNOSIS — G40919 Epilepsy, unspecified, intractable, without status epilepticus: Secondary | ICD-10-CM

## 2023-10-25 LAB — BASIC METABOLIC PANEL
Anion gap: 5 (ref 5–15)
Anion gap: 7 (ref 5–15)
BUN: 6 mg/dL — ABNORMAL LOW (ref 8–23)
BUN: 6 mg/dL — ABNORMAL LOW (ref 8–23)
CO2: 24 mmol/L (ref 22–32)
CO2: 24 mmol/L (ref 22–32)
Calcium: 7.7 mg/dL — ABNORMAL LOW (ref 8.9–10.3)
Calcium: 8.1 mg/dL — ABNORMAL LOW (ref 8.9–10.3)
Chloride: 107 mmol/L (ref 98–111)
Chloride: 105 mmol/L (ref 98–111)
Creatinine, Ser: 0.74 mg/dL (ref 0.61–1.24)
Creatinine, Ser: 0.81 mg/dL (ref 0.61–1.24)
GFR, Estimated: >60 mL/min (ref >60)
GFR, Estimated: >60 mL/min (ref >60)
Glucose, Bld: 97 mg/dL (ref 70–99)
Glucose, Bld: 98 mg/dL (ref 70–99)
Potassium: 3.1 mmol/L — ABNORMAL LOW (ref 3.5–5.1)
Potassium: 3.1 mmol/L — ABNORMAL LOW (ref 3.5–5.1)
Sodium: 136 mmol/L (ref 135–145)
Sodium: 136 mmol/L (ref 135–145)

## 2023-10-25 LAB — CBC
HCT: 35.6 % — ABNORMAL LOW (ref 39.0–52.0)
Hemoglobin: 11.9 g/dL — ABNORMAL LOW (ref 13.0–17.0)
MCH: 30.8 pg (ref 26.0–34.0)
MCHC: 33.4 g/dL (ref 30.0–36.0)
MCV: 92.2 fL (ref 80.0–100.0)
Platelets: 211 10*3/uL (ref 150–400)
RBC: 3.86 MIL/uL — ABNORMAL LOW (ref 4.22–5.81)
RDW: 13.4 % (ref 11.5–15.5)
WBC: 6.9 10*3/uL (ref 4.0–10.5)
nRBC: 0 % (ref 0.0–0.2)

## 2023-10-25 LAB — MAGNESIUM: Magnesium: 2.1 mg/dL (ref 1.7–2.4)

## 2023-10-25 MED ORDER — DOCUSATE SODIUM 100 MG PO CAPS: 100.0000 mg | ORAL_CAPSULE | Freq: Every day | ORAL | Status: AC | PRN

## 2023-10-25 MED ORDER — CARBAMAZEPINE ER 200 MG PO TB12: 600.0000 mg | ORAL_TABLET | Freq: Every morning | ORAL | Status: DC

## 2023-10-25 MED ORDER — IOHEXOL 350 MG/ML SOLN
75.0000 mL | Freq: Once | INTRAVENOUS | Status: AC | PRN
  Administered 2023-10-25: 75 mL via INTRAVENOUS

## 2023-10-25 MED ORDER — CARBAMAZEPINE ER 200 MG PO TB12
800.0000 mg | ORAL_TABLET | Freq: Every day | ORAL | Status: DC
  Filled 2023-10-25: qty 4

## 2023-10-25 MED ORDER — VALTOCO 20 MG DOSE 10 MG/0.1ML NA LQPK
20.0000 mg | NASAL | 2 refills | Status: AC | PRN
  Filled 2023-10-25: qty 10, 30d supply, fill #0

## 2023-10-25 MED ORDER — METHOCARBAMOL 500 MG PO TABS
500.0000 mg | ORAL_TABLET | Freq: Four times a day (QID) | ORAL | 0 refills | Status: AC | PRN
  Filled 2023-10-25: qty 60, 15d supply, fill #0

## 2023-10-25 MED ORDER — OXYCODONE HCL 5 MG PO TABS
5.0000 mg | ORAL_TABLET | Freq: Four times a day (QID) | ORAL | 0 refills | Status: AC | PRN
  Filled 2023-10-25: qty 20, 5d supply, fill #0

## 2023-10-25 MED ORDER — POTASSIUM CHLORIDE 20 MEQ PO PACK
60.0000 meq | PACK | Freq: Once | ORAL | Status: AC
  Administered 2023-10-25: 60 meq via ORAL
  Filled 2023-10-25: qty 3

## 2023-10-25 MED ORDER — ACETAMINOPHEN 500 MG PO TABS: 1000.0000 mg | ORAL_TABLET | Freq: Four times a day (QID) | ORAL | Status: AC | PRN

## 2023-10-25 NOTE — Telephone Encounter (Signed)
Pharmacy Patient Advocate Encounter  Insurance verification completed.    The patient is insured through [place]. Patient has Medicare and is not eligible for a copay card, but may be able to apply for patient assistance, if available.    Ran test claim for Valtoco 20 mg dose and the current 30 day co-pay is $0.00.   This test claim was processed through [facility]- copay amounts may vary at other pharmacies due to pharmacy/plan contracts, or as the patient moves through the different stages of their insurance plan.

## 2023-10-25 NOTE — Progress Notes (Signed)
Blake Richards called back and stated she is on the way to pick up the patient. RN gave her DC instructions VIA the phone and she stated understanding. TOC medications have been picked up and pt is dressed and waiting on his ride

## 2023-10-25 NOTE — Progress Notes (Signed)
Subjective: No seizure.  Denies any concerns.  One of the caregivers from group home was at bedside.  ROS: negative except above  Examination  Vital signs in last 24 hours: Temp:  [97.8 F (36.6 C)-98.9 F (37.2 C)] 98.6 F (37 C) (12/19 0432) Pulse Rate:  [57-101] 80 (12/19 0432) Resp:  [16-18] 18 (12/19 0432) BP: (140-195)/(81-96) 155/88 (12/19 0432) SpO2:  [94 %-100 %] 94 % (12/19 0432)  General: lying in bed, NAD  Neuro: MS: Alert, oriented, follows commands CN: pupils equal and reactive,  EOMI, face symmetric, tongue midline, normal sensation over face Motor: 5/5 strength in all 4 extremities Reflexes: 2+ bilaterally over patella, biceps, plantars: flexor Coordination: normal Gait: not tested  Basic Metabolic Panel: Recent Labs  Lab 10/21/23 1134 10/22/23 0445 10/23/23 0610 10/23/23 0612 10/24/23 0654 10/24/23 0655 10/24/23 1920  NA 142 143  --  140  --  137 138  K 3.3* 4.2  --  2.6*  --  2.7* 3.1*  CL 106 112*  --  109  --  108 107  CO2 27 27  --  22  --  23 22  GLUCOSE 116* 108*  --  97  --  112* 107*  BUN 24* 21  --  21  --  12 10  CREATININE 1.15 1.04  --  0.95  --  0.90 0.88  CALCIUM 9.6 8.3*  --  8.1*  --  8.0* 8.2*  MG  --   --  2.1  --  2.3  --  2.3    CBC: Recent Labs  Lab 10/21/23 1134 10/22/23 0445 10/23/23 0612 10/24/23 0655 10/24/23 2312  WBC 12.7* 7.6 6.7 7.3 6.3  NEUTROABS 10.7*  --   --   --   --   HGB 15.8 14.0 12.3* 12.3* 12.1*  HCT 47.6 42.8 37.4* 36.6* 36.4*  MCV 93.3 95.1 94.0 91.7 92.4  PLT 287 212 194 205 198     Coagulation Studies: No results for input(s): "LABPROT", "INR" in the last 72 hours.  Imaging No new brain imaging  ASSESSMENT AND PLAN:  67 year old male with history of epilepsy who was admitted for SBO and underwent hernia repair.  Had breakthrough seizures today   Epilepsy with breakthrough seizure -Likely due to missing antiseizure medications   Recommendations: -Patient has been able to tolerate  p.o.  Therefore resuming home antiseizure medications -Rescue medication: intranasal valtoco 20mg  for sz lasting over 2 minutes -EEG ordered and pending -As needed IV ativan  for clinical seizures -Discussed plan with surgery team  Seizure precautions: Per [facility] statutes, patients with seizures are not allowed to drive until they have been seizure-free for six months and cleared by a physician    Use caution when using heavy equipment or power tools. Avoid working on ladders or at heights. Take showers instead of baths. Ensure the water temperature is not too high on the home water heater. Do not go swimming alone. Do not lock yourself in a room alone (i.e. bathroom). When caring for infants or small children, sit down when holding, feeding, or changing them to minimize risk of injury to the child in the event you have a seizure. Maintain good sleep hygiene. Avoid alcohol.    If patient has another seizure, call 911 and bring them back to the ED if: A.  The seizure lasts longer than 5 minutes.      B.  The patient doesn't wake shortly after the seizure or has new  problems such as difficulty seeing, speaking or moving following the seizure C.  The patient was injured during the seizure D.  The patient has a temperature over 102 F (39C) E.  The patient vomited during the seizure and now is having trouble breathing    During the Seizure   - First, ensure adequate ventilation and place patients on the floor on their left side  Loosen clothing around the neck and ensure the airway is patent. If the patient is clenching the teeth, do not force the mouth open with any object as this can cause severe damage - Remove all items from the surrounding that can be hazardous. The patient may be oblivious to what's happening and may not even know what he or she is doing. If the patient is confused and wandering, either gently guide him/her away and block access to outside areas - Reassure the  individual and be comforting - Call 911. In most cases, the seizure ends before EMS arrives. However, there are cases when seizures may last over 3 to 5 minutes. Or the individual may have developed breathing difficulties or severe injuries. If a pregnant patient or a person with diabetes develops a seizure, it is prudent to call an ambulance.    After the Seizure (Postictal Stage)   After a seizure, most patients experience confusion, fatigue, muscle pain and/or a headache. Thus, one should permit the individual to sleep. For the next few days, reassurance is essential. Being calm and helping reorient the person is also of importance.   Most seizures are painless and end spontaneously. Seizures are not harmful to others but can lead to complications such as stress on the lungs, brain and the heart. Individuals with prior lung problems may develop labored breathing and respiratory distress.   I have spent a total of  36 minutes with the patient reviewing hospital notes,  test results, labs and examining the patient as well as establishing an assessment and plan that was discussed personally with the patient.  > 50% of time was spent in direct patient care.         Lindie Spruce Epilepsy [facility] For questions after 5pm please refer to AMION to reach the Neurologist on call

## 2023-10-25 NOTE — Plan of Care (Signed)
  Problem: Education: Goal: Knowledge of General Education information will improve Description: Including pain rating scale, medication(s)/side effects and non-pharmacologic comfort measures Outcome: Progressing   Problem: Activity: Goal: Risk for activity intolerance will decrease Outcome: Progressing   Problem: Nutrition: Goal: Adequate nutrition will be maintained Outcome: Progressing   Problem: Elimination: Goal: Will not experience complications related to urinary retention Outcome: Progressing   Problem: Pain Management: Goal: General experience of comfort will improve Outcome: Progressing

## 2023-10-25 NOTE — Progress Notes (Signed)
Attempted to call Blake Richards again from the group home 3x no anser LVM x3 no response [name] made aware as well.

## 2023-10-25 NOTE — Discharge Summary (Addendum)
[facility] Discharge Summary   Patient ID: Blake Richards MRN: [id number] DOB/AGE: [birth year] 67 y.o.  Admit date: 10/21/2023 Discharge date: 10/25/2023  Admitting Diagnosis: Incarcerated indirect inguinal hernia  Discharge Diagnosis Patient Active Problem List   Diagnosis Date Noted   Inguinal hernia 10/22/2023   Status post surgery 10/21/2023   Inguinal hernia with bowel obstruction 10/21/2023   Hypothyroidism 10/06/2015   Epilepsy (HCC) 09/06/2015   Autism 09/06/2015    Consultants Neurolgy  Imaging: CT ANGIO GI BLEED Result Date: 10/25/2023 CLINICAL DATA:  New onset hematochezia. EXAM: CTA ABDOMEN AND PELVIS WITHOUT AND WITH CONTRAST TECHNIQUE: Multidetector CT imaging of the abdomen and pelvis was performed using the standard protocol during bolus administration of intravenous contrast. Multiplanar reconstructed images and MIPs were obtained and reviewed to evaluate the vascular anatomy. RADIATION DOSE REDUCTION: This exam was performed according to the departmental dose-optimization program which includes automated exposure control, adjustment of the mA and/or kV according to patient size and/or use of iterative reconstruction technique. CONTRAST:  75mL OMNIPAQUE IOHEXOL 350 MG/ML SOLN COMPARISON:  CT abdomen and pelvis 10/21/2023. FINDINGS: VASCULAR Aorta: Normal caliber aorta without aneurysm, dissection, vasculitis or significant stenosis. Celiac: Patent without evidence of aneurysm, dissection, vasculitis or significant stenosis. SMA: Patent without evidence of aneurysm, dissection, vasculitis or significant stenosis. Renals: Both renal arteries are patent without evidence of aneurysm, dissection, vasculitis, fibromuscular dysplasia or significant stenosis. Accessory renal arteries are present bilaterally. IMA: Patent without evidence of aneurysm, dissection, vasculitis or significant stenosis. Inflow: Patent without evidence of aneurysm, dissection, vasculitis or  significant stenosis. Proximal Outflow: Bilateral common femoral and visualized portions of the superficial and profunda femoral arteries are patent without evidence of aneurysm, dissection, vasculitis or significant stenosis. Veins: No obvious venous abnormality within the limitations of this arterial phase study. Review of the MIP images confirms the above findings. NON-VASCULAR Lower chest: There is atelectasis in both lung bases. Hepatobiliary: No focal liver abnormality is seen. No gallstones, gallbladder wall thickening, or biliary dilatation. Pancreas: Unremarkable. No pancreatic ductal dilatation or surrounding inflammatory changes. Spleen: Normal in size without focal abnormality. Adrenals/Urinary Tract: There is a 10.0 x 8.8 cm left renal cyst. There is no hydronephrosis or perinephric stranding. The adrenal glands are within normal limits. The bladder is distended and mildly hyperdense. There is a small amount of air in the bladder. Stomach/Bowel: There is diffuse gaseous distention of the colon with air-fluid levels, likely ileus. Small bowel and stomach appear nondilated. There some small bowel loops in the left abdomen demonstrating some wall thickening and mild mesenteric edema. These may be loops of previously identified small bowel trapped within the hernia. There is no pneumatosis or free air. No active gastrointestinal bleeding identified. The appendix is not seen. Lymphatic: No enlarged lymph nodes are identified. Reproductive: Prostate gland is mildly enlarged. Other: There has been interval repair of the left inguinal hernia. There some fluid, stranding in the small amount of air in the left inguinal region. There is likely small hematoma or other fluid collection of the origin of the left inguinal canal measuring 2.7 x 2.2 by 3.2 cm image 9/86. Musculoskeletal: Degenerative changes affect the spine. There is mild chronic compression deformity of L1 which is similar to prior. IMPRESSION: 1. No  evidence for active gastrointestinal bleeding. NON-VASCULAR 1. Diffuse gaseous distention of the colon with air-fluid levels, likely ileus. 2. Small bowel loops in the left abdomen with wall thickening and mild mesenteric edema. These may be loops of small bowel trapped within the  hernia. No pneumatosis or free air. 3. Interval repair of the left inguinal hernia. There is a small hematoma or other fluid collection at the origin of the left inguinal canal measuring 2.7 x 2.2 x 3.2 cm. 4. Distended bladder with mildly hyperdense material. Correlate clinically for hemorrhage. Small amount of air in the bladder may be related to recent instrumentation. 5. Large left renal cyst.  Follow-up imaging no recommended. Electronically Signed   By: Darliss Cheney M.D.   On: 10/25/2023 03:02   EEG adult Result Date: 10/24/2023 Charlsie Quest, MD     10/24/2023  2:48 PM Patient Name: Blake Richards MRN: [id number] Epilepsy Attending: Charlsie Quest Referring Physician/Provider: Clarise Cruz Date: 10/24/2023 Duration: 22.47 mins Patient history: 67 year old male with history of epilepsy who was admitted for SBO and underwent hernia repair.  Had breakthrough seizures today. EEG to evaluate for seizure Level of alertness: Awake, asleep AEDs during EEG study: LEV Technical aspects: This EEG study was done with scalp electrodes positioned according to the 10-20 International system of electrode placement. Electrical activity was reviewed with band pass filter of 1-70Hz , sensitivity of 7 uV/mm, display speed of 30mm/sec with a 60Hz  notched filter applied as appropriate. EEG data were recorded continuously and digitally stored.  Video monitoring was available and reviewed as appropriate. Description: The posterior dominant rhythm consists of 8 Hz activity of moderate voltage (25-35 uV) seen predominantly in posterior head regions, symmetric and reactive to eye opening and eye closing.  Sleep was characterized by vertex  waves, sleep spindles (12 to 14 Hz), maximal frontocentral region. Spikes were noted in left frontal region. Hyperventilation and photic stimulation were not performed.   ABNORMALITY -Spike, left frontal region IMPRESSION: This study is consistent with patient's history of epilepsy arising from left frontal region. No seizures were seen throughout the recording. Charlsie Quest   DG Abd Portable 1V Result Date: 10/24/2023 CLINICAL DATA:  Postop ileus. EXAM: PORTABLE ABDOMEN - 1 VIEW COMPARISON:  X-ray 10/21/2023 and CT scan FINDINGS: Interval removal of the enteric tube. There are several air-filled distended loops of colon diffusely and some dilated loops such as right lower quadrant measuring up to 11.1 cm. Also some dilated small bowel loops measuring up to 5.4 cm. There are possibilities with a ileus favored over obstruction based on overall appearance. There is some air in the rectum. Please correlate with clinical symptoms and continued follow up IMPRESSION: Dilated loops of bowel diffusely both small and large bowel. Although there is a differential ileus is favored over obstruction but please correlate with clinical presentation and history. Continued close surveillance is recommended patient is status post surgical hernia repair Electronically Signed   By: Karen Kays M.D.   On: 10/24/2023 13:15    Procedures Dr. Gust Rung Lovick (10/21/23) - left inguinal hernia repair with mesh   Hospital Course:  Patient is a 67 year old male with autism and epilepsy who presented to the ED with vomiting.  Workup showed incarcerated left inguinal hernia.  Patient was admitted and underwent procedure listed above.  Tolerated procedure well and was transferred to the floor.  Diet was advanced as tolerated.  On POD4, the patient was voiding well, tolerating diet, ambulating well, pain well controlled, vital signs stable, incisions c/d/i and felt stable for discharge home.  Patient will follow up in our office in  2-3 weeks and knows to call with questions or concerns.    Patient did have a seizure on POD3 and neurology  consulted. Home seizure meds were restarted and no further breakthrough seizures.   Blood pressure (!) 160/95, pulse 82, temperature 97.8 F (36.6 C), temperature source Oral, resp. rate 17, height 5\' 10"  (1.778 m), weight 88.5 kg, SpO2 100%. PE: General: pleasant, WD, male who is laying in bed in NAD Heart: regular rate Lungs: Respiratory effort nonlabored on room air Abd: soft, NT, mild distension. Incision cdi with surgical glue MSK: all 4 extremities are symmetrical with no cyanosis, clubbing, or edema. Skin: warm and dry  Psych: A&Ox3 with an appropriate affect.   I or a member of my team have reviewed this patient in the Controlled Substance Database.   Allergies as of 10/25/2023       Reactions   Tropicamide Other (See Comments)   seizures   Ibuprofen    Unknown   Lactose Intolerance (gi) Other (See Comments)   Upset stomach        Medication List     TAKE these medications    acetaminophen 500 MG tablet Commonly known as: TYLENOL Take 2 tablets (1,000 mg total) by mouth every 6 (six) hours as needed for mild pain (pain score 1-3).   acetaZOLAMIDE 250 MG tablet Commonly known as: DIAMOX Take 250 mg by mouth daily.   ALPRAZolam 0.25 MG tablet Commonly known as: XANAX TAKE (1) TABLET BY MOUTH ONCE DAILY.   amLODipine 5 MG tablet Commonly known as: NORVASC Take 5 mg by mouth daily.   carbamazepine 200 MG 12 hr tablet Commonly known as: TEGRETOL XR Take 3 tablets by mouth 2 (two) times daily. Take 3 tablets in the Morning, and 4 tablets at bedtime.   cetirizine 10 MG tablet Commonly known as: ZYRTEC Take 10 mg by mouth daily.   cholecalciferol 25 MCG (1000 UNIT) tablet Commonly known as: VITAMIN D3 Take 1,000 Units by mouth daily.   docusate sodium 100 MG capsule Commonly known as: COLACE Take 1 capsule (100 mg total) by mouth daily as needed  for mild constipation.   levETIRAcetam 500 MG tablet Commonly known as: KEPPRA Take 1,000 mg by mouth 2 (two) times daily. 1,000 in the Morning, and in the evening.   levothyroxine 25 MCG tablet Commonly known as: SYNTHROID Take 25 mcg by mouth.   methocarbamol 500 MG tablet Commonly known as: ROBAXIN Take 1 tablet (500 mg total) by mouth every 6 (six) hours as needed for muscle spasms.   oxyCODONE 5 MG immediate release tablet Commonly known as: Oxy IR/ROXICODONE Take 1 tablet (5 mg total) by mouth every 6 (six) hours as needed for moderate pain (pain score 4-6) or severe pain (pain score 7-10) (5mg  for moderate pain, 10mg  for severe pain).   pravastatin 80 MG tablet Commonly known as: PRAVACHOL Take 80 mg by mouth daily.   sertraline 100 MG tablet Commonly known as: ZOLOFT Take 1 tablet (100 mg total) by mouth daily.   Valtoco 20 MG Dose 10 MG/0.1ML Lqpk Generic drug: diazePAM (20 MG Dose) Place 20 mg into the nose as needed (seizure lasting over 2 minutes).          Follow-up Information     Diamantina Monks, MD. Go on 11/15/2023.   Specialty: Surgery Why: 9am., Please arrive 30 minutes early to complete check in, and bring photo ID and insurance card. Contact information: [address] [facility] [place] [address] [phone]                 Signed: Johnny Bridge  [facility] 10/25/2023, 3:25 PM Please see Amion for pager number during day hours 7:00am-4:30pm

## 2023-10-25 NOTE — Progress Notes (Signed)
CT Called for CT order, new PIV required.  New PIV inserted( see LDA).

## 2023-10-25 NOTE — Progress Notes (Signed)
RN attempted to reach Legal Guardian to notify about pts DC today but no answer, left message will attempt again. RN notified April Evans at [facility] and notified her and she stated understanding will call her back with a time once DC has been placed.

## 2023-10-25 NOTE — Progress Notes (Signed)
RN called Blake Richards pt Legal Guardian and spoke with him notified him about pt DC today he stated understanding. RN attempted to call April evans 2x no answer left VM waiting for call back she is the one who will arrange pick up.

## 2023-11-22 ENCOUNTER — Other Ambulatory Visit (HOSPITAL_COMMUNITY): Payer: Self-pay | Admitting: Psychiatry

## 2023-11-22 DIAGNOSIS — F411 Generalized anxiety disorder: Secondary | ICD-10-CM

## 2023-12-10 ENCOUNTER — Other Ambulatory Visit (HOSPITAL_COMMUNITY): Payer: Self-pay | Admitting: Psychiatry

## 2023-12-10 DIAGNOSIS — F411 Generalized anxiety disorder: Secondary | ICD-10-CM

## 2024-01-15 ENCOUNTER — Other Ambulatory Visit (HOSPITAL_COMMUNITY): Payer: Self-pay

## 2024-01-17 ENCOUNTER — Other Ambulatory Visit (HOSPITAL_COMMUNITY): Payer: Self-pay

## 2024-03-12 ENCOUNTER — Other Ambulatory Visit (HOSPITAL_COMMUNITY): Payer: Self-pay
